# Patient Record
Sex: Female | Born: 1965 | Race: White | Hispanic: No | Smoking: Never smoker
Health system: Southern US, Community
[De-identification: ages and names within clinical notes are randomized; demographics above are authoritative.]

## PROBLEM LIST (undated history)

## (undated) DIAGNOSIS — S32049A Unspecified fracture of fourth lumbar vertebra, initial encounter for closed fracture: Secondary | ICD-10-CM

## (undated) DIAGNOSIS — K219 Gastro-esophageal reflux disease without esophagitis: Secondary | ICD-10-CM

## (undated) DIAGNOSIS — F319 Bipolar disorder, unspecified: Secondary | ICD-10-CM

## (undated) DIAGNOSIS — K439 Ventral hernia without obstruction or gangrene: Secondary | ICD-10-CM

## (undated) DIAGNOSIS — J189 Pneumonia, unspecified organism: Secondary | ICD-10-CM

## (undated) DIAGNOSIS — R519 Headache, unspecified: Secondary | ICD-10-CM

## (undated) DIAGNOSIS — R51 Headache: Secondary | ICD-10-CM

## (undated) DIAGNOSIS — I10 Essential (primary) hypertension: Secondary | ICD-10-CM

## (undated) DIAGNOSIS — F329 Major depressive disorder, single episode, unspecified: Secondary | ICD-10-CM

## (undated) DIAGNOSIS — B1920 Unspecified viral hepatitis C without hepatic coma: Secondary | ICD-10-CM

## (undated) DIAGNOSIS — F419 Anxiety disorder, unspecified: Secondary | ICD-10-CM

## (undated) DIAGNOSIS — F32A Depression, unspecified: Secondary | ICD-10-CM

## (undated) DIAGNOSIS — K7460 Unspecified cirrhosis of liver: Secondary | ICD-10-CM

## (undated) HISTORY — PX: AUGMENTATION MAMMAPLASTY: SUR837

## (undated) HISTORY — PX: ABDOMINAL HYSTERECTOMY: SHX81

## (undated) HISTORY — PX: DILATION AND CURETTAGE OF UTERUS: SHX78

## (undated) HISTORY — PX: ESOPHAGOGASTRODUODENOSCOPY: SHX1529

## (undated) HISTORY — PX: APPENDECTOMY: SHX54

## (undated) HISTORY — PX: ORIF TIBIA & FIBULA FRACTURES: SHX2131

## (undated) HISTORY — PX: FRACTURE SURGERY: SHX138

---

## 1985-04-26 HISTORY — PX: TUBAL LIGATION: SHX77

## 2003-07-22 ENCOUNTER — Emergency Department (HOSPITAL_COMMUNITY): Admission: EM | Admit: 2003-07-22 | Discharge: 2003-07-22 | Payer: Self-pay

## 2011-06-23 ENCOUNTER — Encounter (HOSPITAL_COMMUNITY): Payer: Self-pay | Admitting: *Deleted

## 2011-06-23 ENCOUNTER — Emergency Department (HOSPITAL_COMMUNITY)
Admission: EM | Admit: 2011-06-23 | Discharge: 2011-06-23 | Disposition: A | Discharge status: Home / Self Care | Payer: Medicare Other | Attending: Emergency Medicine | Admitting: Emergency Medicine

## 2011-06-23 DIAGNOSIS — N739 Female pelvic inflammatory disease, unspecified: Secondary | ICD-10-CM | POA: Diagnosis not present

## 2011-06-23 DIAGNOSIS — Z9079 Acquired absence of other genital organ(s): Secondary | ICD-10-CM | POA: Diagnosis not present

## 2011-06-23 DIAGNOSIS — F3289 Other specified depressive episodes: Secondary | ICD-10-CM | POA: Insufficient documentation

## 2011-06-23 DIAGNOSIS — Z9889 Other specified postprocedural states: Secondary | ICD-10-CM | POA: Diagnosis not present

## 2011-06-23 DIAGNOSIS — F329 Major depressive disorder, single episode, unspecified: Secondary | ICD-10-CM | POA: Diagnosis not present

## 2011-06-23 DIAGNOSIS — N390 Urinary tract infection, site not specified: Secondary | ICD-10-CM

## 2011-06-23 DIAGNOSIS — N730 Acute parametritis and pelvic cellulitis: Secondary | ICD-10-CM

## 2011-06-23 HISTORY — DX: Major depressive disorder, single episode, unspecified: F32.9

## 2011-06-23 HISTORY — DX: Depression, unspecified: F32.A

## 2011-06-23 LAB — URINALYSIS, ROUTINE W REFLEX MICROSCOPIC
Bilirubin Urine: NEGATIVE
Glucose, UA: NEGATIVE mg/dL
Ketones, ur: NEGATIVE mg/dL
Nitrite: POSITIVE — AB
Protein, ur: NEGATIVE mg/dL
Specific Gravity, Urine: 1.009 (ref 1.005–1.030)
Urobilinogen, UA: 0.2 mg/dL (ref 0.0–1.0)
pH: 7 (ref 5.0–8.0)

## 2011-06-23 LAB — URINE MICROSCOPIC-ADD ON

## 2011-06-23 LAB — BASIC METABOLIC PANEL
BUN: 11 mg/dL (ref 6–23)
CO2: 25 mEq/L (ref 19–32)
Calcium: 8.8 mg/dL (ref 8.4–10.5)
Chloride: 106 mEq/L (ref 96–112)
Creatinine, Ser: 1.04 mg/dL (ref 0.50–1.10)
GFR calc Af Amer: 74 mL/min — ABNORMAL LOW (ref >90)
GFR calc non Af Amer: 64 mL/min — ABNORMAL LOW (ref >90)
Glucose, Bld: 78 mg/dL (ref 70–99)
Potassium: 3.9 mEq/L (ref 3.5–5.1)
Sodium: 140 mEq/L (ref 135–145)

## 2011-06-23 LAB — CBC
HCT: 38.1 % (ref 36.0–46.0)
Hemoglobin: 12.3 g/dL (ref 12.0–15.0)
MCH: 29.4 pg (ref 26.0–34.0)
MCHC: 32.3 g/dL (ref 30.0–36.0)
MCV: 91.1 fL (ref 78.0–100.0)
Platelets: 219 10*3/uL (ref 150–400)
RBC: 4.18 MIL/uL (ref 3.87–5.11)
RDW: 12.9 % (ref 11.5–15.5)
WBC: 5 10*3/uL (ref 4.0–10.5)

## 2011-06-23 LAB — WET PREP, GENITAL
Clue Cells Wet Prep HPF POC: NONE SEEN
Yeast Wet Prep HPF POC: NONE SEEN

## 2011-06-23 MED ORDER — MORPHINE SULFATE 4 MG/ML IJ SOLN
4.0000 mg | Freq: Once | INTRAMUSCULAR | Status: AC
  Administered 2011-06-23: 4 mg via INTRAVENOUS
  Filled 2011-06-23: qty 1

## 2011-06-23 MED ORDER — ONDANSETRON HCL 4 MG/2ML IJ SOLN
4.0000 mg | Freq: Once | INTRAMUSCULAR | Status: AC
  Administered 2011-06-23: 4 mg via INTRAVENOUS
  Filled 2011-06-23: qty 2

## 2011-06-23 MED ORDER — DOXYCYCLINE HYCLATE 100 MG PO CAPS: 100.0000 mg | ORAL_CAPSULE | Freq: Two times a day (BID) | ORAL | Status: AC

## 2011-06-23 MED ORDER — HYDROCODONE-ACETAMINOPHEN 5-325 MG PO TABS: 1.0000 | ORAL_TABLET | Freq: Four times a day (QID) | ORAL | Status: AC | PRN

## 2011-06-23 MED ORDER — KETOROLAC TROMETHAMINE 30 MG/ML IJ SOLN
30.0000 mg | Freq: Once | INTRAMUSCULAR | Status: AC
  Administered 2011-06-23: 30 mg via INTRAVENOUS
  Filled 2011-06-23: qty 1

## 2011-06-23 MED ORDER — SODIUM CHLORIDE 0.9 % IV BOLUS (SEPSIS)
1000.0000 mL | Freq: Once | INTRAVENOUS | Status: AC
  Administered 2011-06-23: 1000 mL via INTRAVENOUS

## 2011-06-23 MED ORDER — DEXTROSE 5 % IV SOLN
1.0000 g | Freq: Once | INTRAVENOUS | Status: AC
  Administered 2011-06-23: 1 g via INTRAVENOUS
  Filled 2011-06-23: qty 10

## 2011-06-23 MED ORDER — METRONIDAZOLE 500 MG PO TABS
2000.0000 mg | ORAL_TABLET | Freq: Once | ORAL | Status: AC
  Administered 2011-06-23: 2000 mg via ORAL
  Filled 2011-06-23: qty 4

## 2011-06-23 NOTE — ED Provider Notes (Signed)
History     CSN: [id number]  Arrival date & time 06/23/11  0619   First MD Initiated Contact with Patient 06/23/11 [phone]      Chief Complaint  Patient presents with  . Urinary Tract Infection    (Consider location/radiation/quality/duration/timing/severity/associated sxs/prior treatment) HPI Comments: Jamie [name], a 46 year old woman, presents with 8 day history of lower abdominal pain, right sided flank pain, burning with urination, urinary frequency, foul smelling urine, fever and chills. She also reports a 6 day history of fever, nausea, and vomiting. Jamie [name] reports that she has had UTIs in the past and that her symptoms are the same as her previous UTIs. She tried to treat her pain symptoms with "Azo" pills and cranberry juice, without relief. Tylenol every 4 hours has kept her fever down. Nothing makes her pain worse. Jamie [name] describes her lower abdominal pain a burning, aching pain. Her right-sided flank pain radiates to the left, but is worst on the right.   Jamie [name] also reports a green vaginal discharge. She is worried that she might have an STI because her husband cheated on her. She has not been tested for an STI since then. Nothing makes the discharge better or worse.   The history is provided by the patient.    Past Medical History  Diagnosis Date  . Depression     Past Surgical History  Procedure Date  . Abdominal hysterectomy   . Appendectomy   . Cesarean section   . Tibia fracture surgery     left    History reviewed. No pertinent family history.  History  Substance Use Topics  . Smoking status: Never Smoker   . Smokeless tobacco: Not on file  . Alcohol Use: Yes     occaisional    OB History    Grav Para Term Preterm Abortions TAB SAB Ect Mult Living                  Review of Systems  Constitutional: Positive for fever and chills.  Respiratory: Negative for cough and shortness of breath.   Gastrointestinal: Positive for nausea,  vomiting and abdominal pain. Negative for blood in stool and abdominal distention.  Genitourinary: Positive for dysuria, urgency, frequency, flank pain (right sided), vaginal discharge and pelvic pain.  Skin: Negative for rash.    Allergies  Review of patient's allergies indicates no known allergies.  Home Medications   Current Outpatient Rx  Name Route Sig Dispense Refill  . ACETAMINOPHEN 500 MG PO TABS Oral Take 500-1,000 mg by mouth every 6 (six) hours as needed. pain    . BUPROPION HCL ER (SR) 100 MG PO TB12 Oral Take 200 mg by mouth 2 (two) times daily.    Marland Kitchen PHENAZOPYRIDINE HCL 95 MG PO TABS Oral Take 95 mg by mouth 3 (three) times daily as needed. Urinary pain      BP 135/88  Pulse 98  Temp(Src) 98.5 F (36.9 C) (Oral)  Resp 18  SpO2 100%  Physical Exam  Constitutional: She appears well-developed and well-nourished. No distress.  Cardiovascular: Normal rate, regular rhythm and normal heart sounds.   No murmur heard. Pulmonary/Chest: Effort normal and breath sounds normal. No respiratory distress. She has no wheezes. She has no rales.  Abdominal: Soft. Bowel sounds are normal. She exhibits no distension and no mass. There is tenderness. There is no rebound and no guarding.  Genitourinary: Vagina normal.       Purulent, green, foul-smelling discharge visible in  vaginal vault and on speculum. Patient is s/p total hysterectomy. No cervix.   Diffusely tender to palpation in pelvic region on bimanual exam.   Skin: Skin is warm and dry. She is not diaphoretic.    ED Course  Procedures (including critical care time)  Labs Reviewed  URINALYSIS, ROUTINE W REFLEX MICROSCOPIC - Abnormal; Notable for the following:    APPearance CLOUDY (*)    Hgb urine dipstick MODERATE (*)    Nitrite POSITIVE (*)    Leukocytes, UA LARGE (*)    All other components within normal limits  URINE MICROSCOPIC-ADD ON - Abnormal; Notable for the following:    Squamous Epithelial / LPF FEW (*)     Bacteria, UA MANY (*)    All other components within normal limits  URINE CULTURE  CBC  BASIC METABOLIC PANEL  GC/CHLAMYDIA PROBE AMP, GENITAL  WET PREP, GENITAL     The patient has not had any fever or vomiting here in the ER> The patient has been rechecked x5 by me. The patient is stable and tolerating oral fluids. I feel that his is PID based on her HPI and PE. The patient will be treated for this and advised to return here as needed. The patient will be advised to recheck with her PCP as well.     MDM  MDM Reviewed: nursing note and vitals Interpretation: labs            Carlyle Dolly, PA-C 06/23/11 1148

## 2011-06-23 NOTE — ED Notes (Signed)
First meeting with patient. Patient states she has burning and discomfort when she urinates with pain when she pushes down to empty her bladder x 8 days.Patient states she has green/yellow discharge. EDP at bedside.

## 2011-06-23 NOTE — ED Notes (Signed)
Patient resting with NAD at this time.  

## 2011-06-23 NOTE — ED Provider Notes (Signed)
Medical screening examination/treatment/procedure(s) were performed by non-physician practitioner and as supervising physician I was immediately available for consultation/collaboration.   Nat Christen, MD 06/23/11 [phone]

## 2011-06-23 NOTE — ED Notes (Signed)
Pt c/o n/v, fever, urinary frequency/pain for the past 8 days.  States she has had a UTI before and this is what is feels like.  Alert and oriented x 4, MAE.

## 2011-06-23 NOTE — ED Notes (Signed)
Patient resting with NAD

## 2011-06-23 NOTE — ED Notes (Signed)
C/o UTI sx, constant pain, onset 1 week ago, no relief with AZO and cranberry juice, also reports d/c, dysuria, nausea and back pain, (denies: fever or vomiting).

## 2011-06-24 LAB — GC/CHLAMYDIA PROBE AMP, GENITAL
Chlamydia, DNA Probe: NEGATIVE
GC Probe Amp, Genital: NEGATIVE

## 2011-06-25 LAB — URINE CULTURE
Colony Count: >=100000
Culture  Setup Time: 201302271034

## 2011-06-26 NOTE — ED Notes (Addendum)
+   Urine Chart sent to EDP office for review. 

## 2011-06-28 NOTE — ED Notes (Signed)
Patient informed of positive results after id'd x 2 and informed of need to notify partner to be treated.   Chart returned from EDP office and rx written for bactrim DS 1 tab po BID x 3 days needs to be called to [facility] [phone].rx called by PFM

## 2011-09-10 DIAGNOSIS — F4310 Post-traumatic stress disorder, unspecified: Secondary | ICD-10-CM | POA: Diagnosis not present

## 2011-09-10 DIAGNOSIS — F259 Schizoaffective disorder, unspecified: Secondary | ICD-10-CM | POA: Diagnosis not present

## 2011-11-12 DIAGNOSIS — F4310 Post-traumatic stress disorder, unspecified: Secondary | ICD-10-CM | POA: Diagnosis not present

## 2011-11-21 DIAGNOSIS — R112 Nausea with vomiting, unspecified: Secondary | ICD-10-CM | POA: Diagnosis not present

## 2011-11-21 DIAGNOSIS — R109 Unspecified abdominal pain: Secondary | ICD-10-CM | POA: Diagnosis not present

## 2011-11-21 DIAGNOSIS — N390 Urinary tract infection, site not specified: Secondary | ICD-10-CM | POA: Diagnosis not present

## 2011-11-21 DIAGNOSIS — A499 Bacterial infection, unspecified: Secondary | ICD-10-CM | POA: Diagnosis not present

## 2011-12-23 DIAGNOSIS — R300 Dysuria: Secondary | ICD-10-CM | POA: Diagnosis not present

## 2011-12-23 DIAGNOSIS — R319 Hematuria, unspecified: Secondary | ICD-10-CM | POA: Diagnosis not present

## 2011-12-23 DIAGNOSIS — N390 Urinary tract infection, site not specified: Secondary | ICD-10-CM | POA: Diagnosis not present

## 2011-12-23 DIAGNOSIS — IMO0002 Reserved for concepts with insufficient information to code with codable children: Secondary | ICD-10-CM | POA: Diagnosis not present

## 2012-03-07 DIAGNOSIS — Z23 Encounter for immunization: Secondary | ICD-10-CM | POA: Diagnosis not present

## 2012-03-11 DIAGNOSIS — W010XXA Fall on same level from slipping, tripping and stumbling without subsequent striking against object, initial encounter: Secondary | ICD-10-CM | POA: Diagnosis not present

## 2012-03-11 DIAGNOSIS — S93409A Sprain of unspecified ligament of unspecified ankle, initial encounter: Secondary | ICD-10-CM | POA: Diagnosis not present

## 2012-05-03 DIAGNOSIS — F259 Schizoaffective disorder, unspecified: Secondary | ICD-10-CM | POA: Diagnosis not present

## 2012-05-31 DIAGNOSIS — R112 Nausea with vomiting, unspecified: Secondary | ICD-10-CM | POA: Diagnosis not present

## 2012-05-31 DIAGNOSIS — A499 Bacterial infection, unspecified: Secondary | ICD-10-CM | POA: Diagnosis not present

## 2012-05-31 DIAGNOSIS — E860 Dehydration: Secondary | ICD-10-CM | POA: Diagnosis not present

## 2012-05-31 DIAGNOSIS — M545 Low back pain, unspecified: Secondary | ICD-10-CM | POA: Diagnosis not present

## 2012-05-31 DIAGNOSIS — N390 Urinary tract infection, site not specified: Secondary | ICD-10-CM | POA: Diagnosis not present

## 2012-07-01 DIAGNOSIS — R5381 Other malaise: Secondary | ICD-10-CM | POA: Diagnosis not present

## 2012-07-01 DIAGNOSIS — IMO0002 Reserved for concepts with insufficient information to code with codable children: Secondary | ICD-10-CM | POA: Diagnosis not present

## 2012-07-01 DIAGNOSIS — R300 Dysuria: Secondary | ICD-10-CM | POA: Diagnosis not present

## 2012-07-03 DIAGNOSIS — B9689 Other specified bacterial agents as the cause of diseases classified elsewhere: Secondary | ICD-10-CM | POA: Diagnosis not present

## 2012-07-03 DIAGNOSIS — M79609 Pain in unspecified limb: Secondary | ICD-10-CM | POA: Diagnosis not present

## 2012-07-03 DIAGNOSIS — A419 Sepsis, unspecified organism: Secondary | ICD-10-CM | POA: Diagnosis not present

## 2012-07-03 DIAGNOSIS — L0291 Cutaneous abscess, unspecified: Secondary | ICD-10-CM | POA: Diagnosis not present

## 2012-07-03 DIAGNOSIS — E860 Dehydration: Secondary | ICD-10-CM | POA: Diagnosis not present

## 2012-07-03 DIAGNOSIS — K219 Gastro-esophageal reflux disease without esophagitis: Secondary | ICD-10-CM | POA: Diagnosis not present

## 2012-07-03 DIAGNOSIS — F319 Bipolar disorder, unspecified: Secondary | ICD-10-CM | POA: Diagnosis not present

## 2012-07-03 DIAGNOSIS — I808 Phlebitis and thrombophlebitis of other sites: Secondary | ICD-10-CM | POA: Diagnosis not present

## 2012-07-03 DIAGNOSIS — I82409 Acute embolism and thrombosis of unspecified deep veins of unspecified lower extremity: Secondary | ICD-10-CM | POA: Diagnosis not present

## 2012-07-03 DIAGNOSIS — IMO0002 Reserved for concepts with insufficient information to code with codable children: Secondary | ICD-10-CM | POA: Diagnosis not present

## 2012-07-03 DIAGNOSIS — I76 Septic arterial embolism: Secondary | ICD-10-CM | POA: Diagnosis not present

## 2012-07-03 DIAGNOSIS — A499 Bacterial infection, unspecified: Secondary | ICD-10-CM | POA: Diagnosis not present

## 2012-07-03 DIAGNOSIS — F3189 Other bipolar disorder: Secondary | ICD-10-CM | POA: Diagnosis not present

## 2012-07-03 DIAGNOSIS — E876 Hypokalemia: Secondary | ICD-10-CM | POA: Diagnosis present

## 2012-07-03 DIAGNOSIS — I809 Phlebitis and thrombophlebitis of unspecified site: Secondary | ICD-10-CM | POA: Diagnosis not present

## 2012-07-03 DIAGNOSIS — N390 Urinary tract infection, site not specified: Secondary | ICD-10-CM | POA: Diagnosis not present

## 2012-07-10 DIAGNOSIS — I82629 Acute embolism and thrombosis of deep veins of unspecified upper extremity: Secondary | ICD-10-CM | POA: Diagnosis not present

## 2012-07-12 DIAGNOSIS — K5900 Constipation, unspecified: Secondary | ICD-10-CM | POA: Diagnosis not present

## 2012-07-12 DIAGNOSIS — I82629 Acute embolism and thrombosis of deep veins of unspecified upper extremity: Secondary | ICD-10-CM | POA: Diagnosis not present

## 2012-07-12 DIAGNOSIS — L0390 Cellulitis, unspecified: Secondary | ICD-10-CM | POA: Diagnosis not present

## 2012-07-12 DIAGNOSIS — Z7901 Long term (current) use of anticoagulants: Secondary | ICD-10-CM | POA: Diagnosis not present

## 2012-07-12 DIAGNOSIS — Z79899 Other long term (current) drug therapy: Secondary | ICD-10-CM | POA: Diagnosis not present

## 2012-07-12 DIAGNOSIS — I82409 Acute embolism and thrombosis of unspecified deep veins of unspecified lower extremity: Secondary | ICD-10-CM | POA: Diagnosis not present

## 2012-07-12 DIAGNOSIS — L0291 Cutaneous abscess, unspecified: Secondary | ICD-10-CM | POA: Diagnosis not present

## 2012-07-13 DIAGNOSIS — R5381 Other malaise: Secondary | ICD-10-CM | POA: Diagnosis not present

## 2012-07-13 DIAGNOSIS — K5900 Constipation, unspecified: Secondary | ICD-10-CM | POA: Diagnosis not present

## 2012-07-13 DIAGNOSIS — R112 Nausea with vomiting, unspecified: Secondary | ICD-10-CM | POA: Diagnosis not present

## 2012-07-13 DIAGNOSIS — F17200 Nicotine dependence, unspecified, uncomplicated: Secondary | ICD-10-CM | POA: Diagnosis not present

## 2012-07-18 DIAGNOSIS — I82409 Acute embolism and thrombosis of unspecified deep veins of unspecified lower extremity: Secondary | ICD-10-CM | POA: Diagnosis not present

## 2012-07-18 DIAGNOSIS — G894 Chronic pain syndrome: Secondary | ICD-10-CM | POA: Diagnosis not present

## 2012-07-18 DIAGNOSIS — Z79899 Other long term (current) drug therapy: Secondary | ICD-10-CM | POA: Diagnosis not present

## 2012-10-09 DIAGNOSIS — R300 Dysuria: Secondary | ICD-10-CM | POA: Diagnosis not present

## 2012-10-09 DIAGNOSIS — I82409 Acute embolism and thrombosis of unspecified deep veins of unspecified lower extremity: Secondary | ICD-10-CM | POA: Diagnosis not present

## 2012-10-09 DIAGNOSIS — R791 Abnormal coagulation profile: Secondary | ICD-10-CM | POA: Diagnosis not present

## 2012-10-11 DIAGNOSIS — R791 Abnormal coagulation profile: Secondary | ICD-10-CM | POA: Diagnosis not present

## 2012-11-21 DIAGNOSIS — F4310 Post-traumatic stress disorder, unspecified: Secondary | ICD-10-CM | POA: Diagnosis not present

## 2013-02-12 DIAGNOSIS — IMO0002 Reserved for concepts with insufficient information to code with codable children: Secondary | ICD-10-CM | POA: Diagnosis not present

## 2013-02-12 DIAGNOSIS — N390 Urinary tract infection, site not specified: Secondary | ICD-10-CM | POA: Diagnosis not present

## 2013-04-27 DIAGNOSIS — J111 Influenza due to unidentified influenza virus with other respiratory manifestations: Secondary | ICD-10-CM | POA: Diagnosis not present

## 2013-06-06 DIAGNOSIS — F259 Schizoaffective disorder, unspecified: Secondary | ICD-10-CM | POA: Diagnosis not present

## 2013-07-05 DIAGNOSIS — F3113 Bipolar disorder, current episode manic without psychotic features, severe: Secondary | ICD-10-CM | POA: Diagnosis not present

## 2013-07-20 DIAGNOSIS — Z79899 Other long term (current) drug therapy: Secondary | ICD-10-CM | POA: Diagnosis not present

## 2013-07-24 DIAGNOSIS — Z79899 Other long term (current) drug therapy: Secondary | ICD-10-CM | POA: Diagnosis not present

## 2013-07-26 DIAGNOSIS — F3113 Bipolar disorder, current episode manic without psychotic features, severe: Secondary | ICD-10-CM | POA: Diagnosis not present

## 2013-08-22 DIAGNOSIS — IMO0002 Reserved for concepts with insufficient information to code with codable children: Secondary | ICD-10-CM | POA: Diagnosis not present

## 2013-10-03 DIAGNOSIS — Z79899 Other long term (current) drug therapy: Secondary | ICD-10-CM | POA: Diagnosis not present

## 2013-10-04 DIAGNOSIS — F3113 Bipolar disorder, current episode manic without psychotic features, severe: Secondary | ICD-10-CM | POA: Diagnosis not present

## 2013-10-24 DIAGNOSIS — R300 Dysuria: Secondary | ICD-10-CM | POA: Diagnosis not present

## 2013-10-24 DIAGNOSIS — R599 Enlarged lymph nodes, unspecified: Secondary | ICD-10-CM | POA: Diagnosis not present

## 2013-10-24 DIAGNOSIS — N390 Urinary tract infection, site not specified: Secondary | ICD-10-CM | POA: Diagnosis not present

## 2013-11-21 DIAGNOSIS — F3113 Bipolar disorder, current episode manic without psychotic features, severe: Secondary | ICD-10-CM | POA: Diagnosis not present

## 2014-03-13 DIAGNOSIS — F3111 Bipolar disorder, current episode manic without psychotic features, mild: Secondary | ICD-10-CM | POA: Diagnosis not present

## 2014-04-10 DIAGNOSIS — F3113 Bipolar disorder, current episode manic without psychotic features, severe: Secondary | ICD-10-CM | POA: Diagnosis not present

## 2014-04-15 DIAGNOSIS — Z79899 Other long term (current) drug therapy: Secondary | ICD-10-CM | POA: Diagnosis not present

## 2014-05-02 DIAGNOSIS — F3113 Bipolar disorder, current episode manic without psychotic features, severe: Secondary | ICD-10-CM | POA: Diagnosis not present

## 2014-05-08 DIAGNOSIS — Z205 Contact with and (suspected) exposure to viral hepatitis: Secondary | ICD-10-CM | POA: Diagnosis not present

## 2014-05-08 DIAGNOSIS — B1920 Unspecified viral hepatitis C without hepatic coma: Secondary | ICD-10-CM | POA: Diagnosis not present

## 2014-06-19 DIAGNOSIS — F3113 Bipolar disorder, current episode manic without psychotic features, severe: Secondary | ICD-10-CM | POA: Diagnosis not present

## 2014-07-24 DIAGNOSIS — A5901 Trichomonal vulvovaginitis: Secondary | ICD-10-CM | POA: Diagnosis not present

## 2014-07-24 DIAGNOSIS — Z6827 Body mass index (BMI) 27.0-27.9, adult: Secondary | ICD-10-CM | POA: Diagnosis not present

## 2014-08-02 ENCOUNTER — Other Ambulatory Visit: Payer: Self-pay | Admitting: Gastroenterology

## 2014-08-02 DIAGNOSIS — B1920 Unspecified viral hepatitis C without hepatic coma: Secondary | ICD-10-CM | POA: Diagnosis not present

## 2014-08-02 DIAGNOSIS — R634 Abnormal weight loss: Secondary | ICD-10-CM

## 2014-08-02 DIAGNOSIS — R109 Unspecified abdominal pain: Secondary | ICD-10-CM | POA: Diagnosis not present

## 2014-08-06 ENCOUNTER — Other Ambulatory Visit: Payer: Self-pay

## 2014-08-06 DIAGNOSIS — F3113 Bipolar disorder, current episode manic without psychotic features, severe: Secondary | ICD-10-CM | POA: Diagnosis not present

## 2014-08-07 ENCOUNTER — Other Ambulatory Visit (HOSPITAL_COMMUNITY): Payer: Self-pay | Admitting: Gastroenterology

## 2014-08-07 DIAGNOSIS — B1920 Unspecified viral hepatitis C without hepatic coma: Secondary | ICD-10-CM

## 2014-08-07 DIAGNOSIS — B182 Chronic viral hepatitis C: Secondary | ICD-10-CM

## 2014-08-09 ENCOUNTER — Other Ambulatory Visit: Payer: Self-pay | Admitting: Gastroenterology

## 2014-08-09 ENCOUNTER — Ambulatory Visit
Admission: RE | Admit: 2014-08-09 | Discharge: 2014-08-09 | Disposition: A | Discharge status: Home / Self Care | Payer: Medicare Other | Source: Ambulatory Visit | Attending: Gastroenterology | Admitting: Gastroenterology

## 2014-08-09 DIAGNOSIS — R112 Nausea with vomiting, unspecified: Secondary | ICD-10-CM | POA: Diagnosis not present

## 2014-08-09 DIAGNOSIS — R634 Abnormal weight loss: Secondary | ICD-10-CM

## 2014-08-09 DIAGNOSIS — K828 Other specified diseases of gallbladder: Secondary | ICD-10-CM | POA: Diagnosis not present

## 2014-08-09 DIAGNOSIS — K869 Disease of pancreas, unspecified: Secondary | ICD-10-CM

## 2014-08-09 DIAGNOSIS — K838 Other specified diseases of biliary tract: Secondary | ICD-10-CM | POA: Diagnosis not present

## 2014-08-23 ENCOUNTER — Ambulatory Visit
Admission: RE | Admit: 2014-08-23 | Discharge: 2014-08-23 | Disposition: A | Discharge status: Home / Self Care | Payer: Medicare Other | Source: Ambulatory Visit | Attending: Gastroenterology | Admitting: Gastroenterology

## 2014-08-23 DIAGNOSIS — K869 Disease of pancreas, unspecified: Secondary | ICD-10-CM

## 2014-08-24 ENCOUNTER — Ambulatory Visit
Admission: RE | Admit: 2014-08-24 | Discharge: 2014-08-24 | Disposition: A | Discharge status: Home / Self Care | Payer: Medicare Other | Source: Ambulatory Visit | Attending: Gastroenterology | Admitting: Gastroenterology

## 2014-08-24 DIAGNOSIS — B182 Chronic viral hepatitis C: Secondary | ICD-10-CM | POA: Diagnosis not present

## 2014-08-24 DIAGNOSIS — D1803 Hemangioma of intra-abdominal structures: Secondary | ICD-10-CM | POA: Diagnosis not present

## 2014-08-24 DIAGNOSIS — K8020 Calculus of gallbladder without cholecystitis without obstruction: Secondary | ICD-10-CM | POA: Diagnosis not present

## 2014-08-24 MED ORDER — GADOBENATE DIMEGLUMINE 529 MG/ML IV SOLN
13.0000 mL | Freq: Once | INTRAVENOUS | Status: AC | PRN
  Administered 2014-08-24: 13 mL via INTRAVENOUS

## 2014-08-26 ENCOUNTER — Telehealth (HOSPITAL_COMMUNITY): Payer: Self-pay

## 2014-08-26 NOTE — Telephone Encounter (Signed)
Called and spoke to pt to remind her of her appt on 08/27/14 at 7am and make sure she will be npo for exam. AW

## 2014-08-27 ENCOUNTER — Ambulatory Visit (HOSPITAL_COMMUNITY)
Admission: RE | Admit: 2014-08-27 | Discharge: 2014-08-27 | Disposition: A | Discharge status: Home / Self Care | Payer: Medicare Other | Source: Ambulatory Visit | Attending: Gastroenterology | Admitting: Gastroenterology

## 2014-08-27 DIAGNOSIS — B1920 Unspecified viral hepatitis C without hepatic coma: Secondary | ICD-10-CM | POA: Diagnosis not present

## 2014-08-27 DIAGNOSIS — R109 Unspecified abdominal pain: Secondary | ICD-10-CM | POA: Diagnosis not present

## 2014-08-27 DIAGNOSIS — K8020 Calculus of gallbladder without cholecystitis without obstruction: Secondary | ICD-10-CM | POA: Diagnosis not present

## 2014-08-27 DIAGNOSIS — K7460 Unspecified cirrhosis of liver: Secondary | ICD-10-CM | POA: Diagnosis not present

## 2014-08-27 DIAGNOSIS — K828 Other specified diseases of gallbladder: Secondary | ICD-10-CM | POA: Diagnosis not present

## 2014-09-24 DIAGNOSIS — K222 Esophageal obstruction: Secondary | ICD-10-CM | POA: Diagnosis not present

## 2014-09-24 DIAGNOSIS — K2970 Gastritis, unspecified, without bleeding: Secondary | ICD-10-CM | POA: Diagnosis not present

## 2014-09-24 DIAGNOSIS — K449 Diaphragmatic hernia without obstruction or gangrene: Secondary | ICD-10-CM | POA: Diagnosis not present

## 2014-09-24 DIAGNOSIS — R1013 Epigastric pain: Secondary | ICD-10-CM | POA: Diagnosis not present

## 2014-09-24 DIAGNOSIS — K7460 Unspecified cirrhosis of liver: Secondary | ICD-10-CM | POA: Diagnosis not present

## 2014-09-24 DIAGNOSIS — K209 Esophagitis, unspecified: Secondary | ICD-10-CM | POA: Diagnosis not present

## 2014-12-13 DIAGNOSIS — B1920 Unspecified viral hepatitis C without hepatic coma: Secondary | ICD-10-CM | POA: Diagnosis not present

## 2014-12-25 ENCOUNTER — Encounter (HOSPITAL_COMMUNITY): Payer: Self-pay | Admitting: *Deleted

## 2014-12-25 ENCOUNTER — Ambulatory Visit: Payer: Self-pay | Admitting: Surgery

## 2014-12-25 DIAGNOSIS — K8010 Calculus of gallbladder with chronic cholecystitis without obstruction: Secondary | ICD-10-CM | POA: Diagnosis present

## 2014-12-25 MED ORDER — CEFAZOLIN SODIUM-DEXTROSE 2-3 GM-% IV SOLR
2.0000 g | INTRAVENOUS | Status: AC
  Administered 2014-12-26: 2 g via INTRAVENOUS
  Filled 2014-12-25: qty 50

## 2014-12-25 NOTE — H&P (Signed)
General Surgery [facility]  Jamie [name]. Caracci 12/25/2014 9:19 AM Location: [facility] Patient #: [id number] DOB: June 25, 1965 Single / Language: Jamie [name] / Race: White Female  History of Present Illness Jamie Regal MD; 12/25/2014 9:57 AM) Patient words: gallbladder.  The patient is a 49 year old female who presents for evaluation of gall stones. Patient is referred by Dr. Wonda [name] for surgical treatment of symptomatic cholelithiasis and chronic cholecystitis. Patient presented in the spring with abdominal pain. This was initially intermittent. Pain has become more constant and more severe over the past 2 weeks. She has had nausea and vomiting. She experiences chills. She has been using Phenergan suppositories for control of symptoms. Patient notes that pretty much all foods exacerbate her pain. She denies any history of jaundice or acholic stools. She has had problems with constipation. Evaluation has included abdominal ultrasound which shows gallbladder sludge and small gallstones. Patient underwent an MRI scan to rule out a pancreatic mass and this study also confirmed multiple gallstones without choledocholithiasis. Patient has had an upper endoscopy showing esophagitis. Previous abdominal surgery includes gynecologic procedures only. Patient now presents for consideration for cholecystectomy for symptomatic cholelithiasis, abdominal pain, and probable chronic cholecystitis. Patient does have a history of liver disease and possible hepatitis C.   Other Problems Jamie Lorenzo, LPN; 9/38/1017 5:10 AM) Alcohol Abuse Anxiety Disorder Cirrhosis Of Liver Gastroesophageal Reflux Disease Inguinal Hernia  Past Surgical History Jamie Lorenzo, LPN; 2/58/5277 8:24 AM) Cesarean Section - 1  Diagnostic Studies History Jamie Lorenzo, LPN; 2/35/3614 4:31 AM) Colonoscopy never Mammogram 1-3 years ago  Allergies Jamie Lorenzo, LPN;  5/40/0867 6:19 AM) No Known Drug Allergies08/31/2016  Medication History Jamie Lorenzo, LPN; 09/01/3265 1:24 AM) Sucralfate (1GM Tablet, Oral as needed) Active. Aleve (220MG  Tablet, Oral as needed) Active. Excedrin Back & Body (250-250MG  Tablet, Oral as needed) Active. Medications Reconciled  Social History Jamie Lorenzo, LPN; 5/80/9983 3:82 AM) Alcohol use Recently quit alcohol use. Illicit drug use Uses socially only. Tobacco use Never smoker.  Family History Jamie Lorenzo, LPN; 08/29/3974 7:34 AM) Alcohol Abuse Family Members In General. Migraine Headache Mother.  Pregnancy / Birth History Jamie Lorenzo, LPN; 1/93/7902 4:09 AM) Age at menarche 50 years. Age of menopause 61-50 Gravida 71 Maternal age 60-20 Para 3  Review of Systems Jamie Lorenzo LPN; 7/35/3299 2:42 AM) General Present- Appetite Loss, Fatigue and Weight Loss. Not Present- Chills, Fever, Night Sweats and Weight Gain. Skin Present- Dryness and Non-Healing Wounds. Not Present- Change in Wart/Mole, Hives, Jaundice, New Lesions, Rash and Ulcer. HEENT Not Present- Earache, Hearing Loss, Hoarseness, Nose Bleed, Oral Ulcers, Ringing in the Ears, Seasonal Allergies, Sinus Pain, Sore Throat, Visual Disturbances, Wears glasses/contact lenses and Yellow Eyes. Respiratory Not Present- Bloody sputum, Chronic Cough, Difficulty Breathing, Snoring and Wheezing. Breast Not Present- Breast Mass, Breast Pain, Nipple Discharge and Skin Changes. Cardiovascular Not Present- Chest Pain, Difficulty Breathing Lying Down, Leg Cramps, Palpitations, Rapid Heart Rate, Shortness of Breath and Swelling of Extremities. Gastrointestinal Present- Abdominal Pain, Constipation, Nausea and Vomiting. Not Present- Bloating, Bloody Stool, Change in Bowel Habits, Chronic diarrhea, Difficulty Swallowing, Excessive gas, Gets full quickly at meals, Hemorrhoids, Indigestion and Rectal Pain. Female Genitourinary Present- Urgency. Not Present-  Frequency, Nocturia, Painful Urination and Pelvic Pain. Musculoskeletal Present- Muscle Pain. Not Present- Back Pain, Joint Pain, Joint Stiffness, Muscle Weakness and Swelling of Extremities. Neurological Not Present- Decreased Memory, Fainting, Headaches, Numbness, Seizures, Tingling, Tremor, Trouble walking and Weakness. Psychiatric Present- Anxiety, Bipolar and Change in Sleep  Pattern. Not Present- Depression, Fearful and Frequent crying. Hematology Present- Easy Bruising. Not Present- Excessive bleeding, Gland problems, HIV and Persistent Infections.   Vitals Claiborne Billings Dockery LPN; 5/62/5638 9:37 AM) 12/25/2014 9:20 AM Weight: 138 lb Height: 64in Body Surface Area: 1.68 m Body Mass Index: 23.69 kg/m Temp.: 97.62F(Oral)  Pulse: 76 (Regular)  BP: 118/74 (Sitting, Left Arm, Standard)    Physical Exam Jamie Regal MD; 12/25/2014 9:58 AM) The physical exam findings are as follows: Note:General - appears uncomfortable, mild distress; not diaphorectic  HEENT - normocephalic; sclerae clear, gaze conjugate; mucous membranes moist, dentition good; voice normal  Neck - symmetric on extension; no palpable anterior or posterior cervical adenopathy; no palpable masses in the thyroid bed  Chest - clear bilaterally without rhonchi, rales, or wheeze  Cor - regular rhythm with normal rate; no significant murmur  Abd - soft without distension; well-healed surgical incision suprapubic; no palpable masses; mild to moderate tenderness in the epigastrium and right upper quadrant with voluntary guarding; no hepatosplenomegaly  Ext - non-tender without significant edema or lymphedema  Neuro - grossly intact; no tremor    Assessment & Plan Jamie Regal MD; 12/25/2014 10:00 AM) CHRONIC CHOLECYSTITIS WITH CALCULUS (574.10  K80.10) Current Plans  Patient presents on referral from gastroenterology with symptomatic cholelithiasis. Patient is provided with written literature regarding  gallbladder surgery to review at home.  Patient has had increasing symptoms for the past several months. Over the past 2 weeks she has developed persistent abdominal pain and nausea. She has been using Phenergan suppositories without relief. She requests narcotic pain medicine today. Patient would like to proceed with surgery as soon as possible.  Patient discussed laparoscopic cholecystectomy in detail. We discussed the risk and benefits of the procedure including the possibility of conversion to open surgery. We discussed the hospital stay to be anticipated. We discussed her postoperative recovery. She understands and wishes to proceed as soon as possible.  The risks and benefits of the procedure have been discussed at length with the patient. The patient understands the proposed procedure, potential alternative treatments, and the course of recovery to be expected. All of the patient's questions have been answered at this time. The patient wishes to proceed with surgery.  Jamie Regal, MD, [facility] Office: [phone]

## 2014-12-26 ENCOUNTER — Ambulatory Visit (HOSPITAL_COMMUNITY): Payer: Medicare Other | Admitting: Anesthesiology

## 2014-12-26 ENCOUNTER — Ambulatory Visit (HOSPITAL_COMMUNITY): Payer: Medicare Other

## 2014-12-26 ENCOUNTER — Encounter (HOSPITAL_COMMUNITY): Payer: Self-pay | Admitting: *Deleted

## 2014-12-26 ENCOUNTER — Observation Stay (HOSPITAL_COMMUNITY)
Admission: RE | Admit: 2014-12-26 | Discharge: 2014-12-27 | Disposition: A | Discharge status: Home / Self Care | Payer: Medicare Other | Source: Ambulatory Visit | Attending: Surgery | Admitting: Surgery

## 2014-12-26 ENCOUNTER — Encounter (HOSPITAL_COMMUNITY)
Admission: RE | Disposition: A | Discharge status: Home / Self Care | Payer: Self-pay | Source: Ambulatory Visit | Attending: Surgery

## 2014-12-26 DIAGNOSIS — K8010 Calculus of gallbladder with chronic cholecystitis without obstruction: Secondary | ICD-10-CM | POA: Diagnosis not present

## 2014-12-26 DIAGNOSIS — F419 Anxiety disorder, unspecified: Secondary | ICD-10-CM | POA: Insufficient documentation

## 2014-12-26 DIAGNOSIS — K7460 Unspecified cirrhosis of liver: Secondary | ICD-10-CM | POA: Insufficient documentation

## 2014-12-26 DIAGNOSIS — K8020 Calculus of gallbladder without cholecystitis without obstruction: Secondary | ICD-10-CM

## 2014-12-26 DIAGNOSIS — K811 Chronic cholecystitis: Secondary | ICD-10-CM | POA: Diagnosis present

## 2014-12-26 HISTORY — DX: Ventral hernia without obstruction or gangrene: K43.9

## 2014-12-26 HISTORY — DX: Gastro-esophageal reflux disease without esophagitis: K21.9

## 2014-12-26 HISTORY — PX: CHOLECYSTECTOMY: SHX55

## 2014-12-26 HISTORY — DX: Unspecified cirrhosis of liver: K74.60

## 2014-12-26 HISTORY — DX: Bipolar disorder, unspecified: F31.9

## 2014-12-26 LAB — COMPREHENSIVE METABOLIC PANEL
ALT: 20 U/L (ref 14–54)
AST: 37 U/L (ref 15–41)
Albumin: 2.7 g/dL — ABNORMAL LOW (ref 3.5–5.0)
Alkaline Phosphatase: 135 U/L — ABNORMAL HIGH (ref 38–126)
Anion gap: 9 (ref 5–15)
BUN: 12 mg/dL (ref 6–20)
CO2: 23 mmol/L (ref 22–32)
Calcium: 8.3 mg/dL — ABNORMAL LOW (ref 8.9–10.3)
Chloride: 104 mmol/L (ref 101–111)
Creatinine, Ser: 1.13 mg/dL — ABNORMAL HIGH (ref 0.44–1.00)
GFR calc Af Amer: >60 mL/min (ref >60)
GFR calc non Af Amer: 56 mL/min — ABNORMAL LOW (ref >60)
Glucose, Bld: 86 mg/dL (ref 65–99)
Potassium: 3.9 mmol/L (ref 3.5–5.1)
Sodium: 136 mmol/L (ref 135–145)
Total Bilirubin: 0.7 mg/dL (ref 0.3–1.2)
Total Protein: 6.3 g/dL — ABNORMAL LOW (ref 6.5–8.1)

## 2014-12-26 LAB — CBC
HCT: 38.2 % (ref 36.0–46.0)
Hemoglobin: 12.4 g/dL (ref 12.0–15.0)
MCH: 29.9 pg (ref 26.0–34.0)
MCHC: 32.5 g/dL (ref 30.0–36.0)
MCV: 92 fL (ref 78.0–100.0)
Platelets: 208 10*3/uL (ref 150–400)
RBC: 4.15 MIL/uL (ref 3.87–5.11)
RDW: 14.2 % (ref 11.5–15.5)
WBC: 4.3 10*3/uL (ref 4.0–10.5)

## 2014-12-26 SURGERY — LAPAROSCOPIC CHOLECYSTECTOMY WITH INTRAOPERATIVE CHOLANGIOGRAM
Anesthesia: General | Site: Abdomen

## 2014-12-26 MED ORDER — KETAMINE HCL 10 MG/ML IJ SOLN
INTRAMUSCULAR | Status: DC | PRN
  Administered 2014-12-26: 100 mg via INTRAVENOUS

## 2014-12-26 MED ORDER — LIDOCAINE HCL (CARDIAC) 20 MG/ML IV SOLN
INTRAVENOUS | Status: DC | PRN
  Administered 2014-12-26: 50 mg via INTRAVENOUS
  Administered 2014-12-26: 100 mg via INTRATRACHEAL

## 2014-12-26 MED ORDER — MIDAZOLAM HCL 5 MG/5ML IJ SOLN
INTRAMUSCULAR | Status: DC | PRN
  Administered 2014-12-26: 2 mg via INTRAVENOUS

## 2014-12-26 MED ORDER — HYDROMORPHONE HCL 1 MG/ML IJ SOLN
INTRAMUSCULAR | Status: AC
  Filled 2014-12-26: qty 2

## 2014-12-26 MED ORDER — VECURONIUM BROMIDE 10 MG IV SOLR
INTRAVENOUS | Status: DC | PRN
  Administered 2014-12-26: 4 mg via INTRAVENOUS

## 2014-12-26 MED ORDER — GLYCOPYRROLATE 0.2 MG/ML IJ SOLN
INTRAMUSCULAR | Status: AC
  Filled 2014-12-26: qty 3

## 2014-12-26 MED ORDER — DEXMEDETOMIDINE HCL 200 MCG/2ML IV SOLN
INTRAVENOUS | Status: DC | PRN
  Administered 2014-12-26: 10 ug via INTRAVENOUS
  Administered 2014-12-26: 12 ug via INTRAVENOUS
  Administered 2014-12-26: 10 ug via INTRAVENOUS

## 2014-12-26 MED ORDER — SODIUM CHLORIDE 0.9 % IV SOLN
INTRAVENOUS | Status: DC | PRN
  Administered 2014-12-26: 11:00:00

## 2014-12-26 MED ORDER — MEPERIDINE HCL 25 MG/ML IJ SOLN: 6.2500 mg | INTRAMUSCULAR | Status: DC | PRN

## 2014-12-26 MED ORDER — FENTANYL CITRATE (PF) 100 MCG/2ML IJ SOLN
50.0000 ug | Freq: Once | INTRAMUSCULAR | Status: AC
  Administered 2014-12-26: 50 ug via INTRAVENOUS

## 2014-12-26 MED ORDER — VECURONIUM BROMIDE 10 MG IV SOLR
INTRAVENOUS | Status: AC
  Filled 2014-12-26: qty 10

## 2014-12-26 MED ORDER — KETAMINE HCL 100 MG/ML IJ SOLN
INTRAMUSCULAR | Status: AC
  Filled 2014-12-26: qty 1

## 2014-12-26 MED ORDER — PROPOFOL 10 MG/ML IV BOLUS
INTRAVENOUS | Status: AC
  Filled 2014-12-26: qty 20

## 2014-12-26 MED ORDER — DEXMEDETOMIDINE HCL IN NACL 200 MCG/50ML IV SOLN
INTRAVENOUS | Status: AC
  Filled 2014-12-26: qty 50

## 2014-12-26 MED ORDER — PROPOFOL 10 MG/ML IV BOLUS
INTRAVENOUS | Status: DC | PRN
  Administered 2014-12-26: 150 mg via INTRAVENOUS

## 2014-12-26 MED ORDER — FENTANYL CITRATE (PF) 100 MCG/2ML IJ SOLN
INTRAMUSCULAR | Status: DC | PRN
Start: 2014-12-26 — End: 2014-12-26
  Administered 2014-12-26: 100 ug via INTRAVENOUS
  Administered 2014-12-26: 50 ug via INTRAVENOUS
  Administered 2014-12-26: 100 ug via INTRAVENOUS
  Administered 2014-12-26: 50 ug via INTRAVENOUS

## 2014-12-26 MED ORDER — KCL IN DEXTROSE-NACL 20-5-0.45 MEQ/L-%-% IV SOLN
INTRAVENOUS | Status: AC
  Filled 2014-12-26: qty 1000

## 2014-12-26 MED ORDER — NEOSTIGMINE METHYLSULFATE 10 MG/10ML IV SOLN
INTRAVENOUS | Status: DC | PRN
  Administered 2014-12-26: 4 mg via INTRAVENOUS

## 2014-12-26 MED ORDER — ACETAMINOPHEN 650 MG RE SUPP: 650.0000 mg | Freq: Four times a day (QID) | RECTAL | Status: DC | PRN

## 2014-12-26 MED ORDER — ONDANSETRON HCL 4 MG/2ML IJ SOLN
INTRAMUSCULAR | Status: AC
  Filled 2014-12-26: qty 2

## 2014-12-26 MED ORDER — HYDROMORPHONE HCL 1 MG/ML IJ SOLN
0.2500 mg | INTRAMUSCULAR | Status: DC | PRN
  Administered 2014-12-26 (×4): 0.5 mg via INTRAVENOUS

## 2014-12-26 MED ORDER — DEXAMETHASONE SODIUM PHOSPHATE 4 MG/ML IJ SOLN
INTRAMUSCULAR | Status: AC
  Filled 2014-12-26: qty 1

## 2014-12-26 MED ORDER — FENTANYL CITRATE (PF) 100 MCG/2ML IJ SOLN
INTRAMUSCULAR | Status: AC
  Filled 2014-12-26: qty 2

## 2014-12-26 MED ORDER — NEOSTIGMINE METHYLSULFATE 10 MG/10ML IV SOLN
INTRAVENOUS | Status: AC
  Filled 2014-12-26: qty 1

## 2014-12-26 MED ORDER — HYDROMORPHONE HCL 1 MG/ML IJ SOLN
1.0000 mg | INTRAMUSCULAR | Status: DC | PRN
  Administered 2014-12-26 – 2014-12-27 (×5): 1 mg via INTRAVENOUS
  Filled 2014-12-26 (×5): qty 1

## 2014-12-26 MED ORDER — KETOROLAC TROMETHAMINE 30 MG/ML IJ SOLN
INTRAMUSCULAR | Status: AC
  Filled 2014-12-26: qty 1

## 2014-12-26 MED ORDER — BUPIVACAINE-EPINEPHRINE 0.25% -1:200000 IJ SOLN
INTRAMUSCULAR | Status: DC | PRN
  Administered 2014-12-26: 30 mL

## 2014-12-26 MED ORDER — PROMETHAZINE HCL 25 MG/ML IJ SOLN
6.2500 mg | INTRAMUSCULAR | Status: DC | PRN
  Administered 2014-12-26: 12.5 mg via INTRAVENOUS

## 2014-12-26 MED ORDER — ROCURONIUM BROMIDE 50 MG/5ML IV SOLN
INTRAVENOUS | Status: AC
  Filled 2014-12-26: qty 1

## 2014-12-26 MED ORDER — KETOROLAC TROMETHAMINE 30 MG/ML IJ SOLN
30.0000 mg | Freq: Once | INTRAMUSCULAR | Status: AC
  Administered 2014-12-26: 30 mg via INTRAVENOUS

## 2014-12-26 MED ORDER — LORAZEPAM 2 MG/ML IJ SOLN
1.0000 mg | Freq: Four times a day (QID) | INTRAMUSCULAR | Status: DC | PRN
  Administered 2014-12-26 – 2014-12-27 (×3): 1 mg via INTRAVENOUS
  Filled 2014-12-26 (×4): qty 1

## 2014-12-26 MED ORDER — ONDANSETRON HCL 4 MG/2ML IJ SOLN
INTRAMUSCULAR | Status: DC | PRN
  Administered 2014-12-26: 4 mg via INTRAVENOUS

## 2014-12-26 MED ORDER — SODIUM CHLORIDE 0.9 % IJ SOLN
INTRAMUSCULAR | Status: AC
  Filled 2014-12-26: qty 10

## 2014-12-26 MED ORDER — DIPHENHYDRAMINE HCL 25 MG PO CAPS
25.0000 mg | ORAL_CAPSULE | ORAL | Status: DC | PRN
  Administered 2014-12-26 – 2014-12-27 (×2): 25 mg via ORAL
  Filled 2014-12-26 (×2): qty 1

## 2014-12-26 MED ORDER — FENTANYL CITRATE (PF) 250 MCG/5ML IJ SOLN
INTRAMUSCULAR | Status: AC
  Filled 2014-12-26: qty 5

## 2014-12-26 MED ORDER — ONDANSETRON 4 MG PO TBDP: 4.0000 mg | ORAL_TABLET | Freq: Four times a day (QID) | ORAL | Status: DC | PRN

## 2014-12-26 MED ORDER — SODIUM CHLORIDE 0.9 % IR SOLN
Status: DC | PRN
  Administered 2014-12-26: 1000 mL

## 2014-12-26 MED ORDER — MIDAZOLAM HCL 2 MG/2ML IJ SOLN
INTRAMUSCULAR | Status: AC
  Filled 2014-12-26: qty 4

## 2014-12-26 MED ORDER — ACETAMINOPHEN 325 MG PO TABS: 650.0000 mg | ORAL_TABLET | Freq: Four times a day (QID) | ORAL | Status: DC | PRN

## 2014-12-26 MED ORDER — LACTATED RINGERS IV SOLN
INTRAVENOUS | Status: DC
  Administered 2014-12-26 (×2): via INTRAVENOUS

## 2014-12-26 MED ORDER — EPHEDRINE SULFATE 50 MG/ML IJ SOLN
INTRAMUSCULAR | Status: DC | PRN
  Administered 2014-12-26 (×2): 5 mg via INTRAVENOUS

## 2014-12-26 MED ORDER — BUPIVACAINE-EPINEPHRINE (PF) 0.25% -1:200000 IJ SOLN
INTRAMUSCULAR | Status: AC
  Filled 2014-12-26: qty 30

## 2014-12-26 MED ORDER — HYDROCODONE-ACETAMINOPHEN 5-325 MG PO TABS
1.0000 | ORAL_TABLET | ORAL | Status: DC | PRN
  Administered 2014-12-26 – 2014-12-27 (×3): 2 via ORAL
  Filled 2014-12-26 (×3): qty 2

## 2014-12-26 MED ORDER — 0.9 % SODIUM CHLORIDE (POUR BTL) OPTIME
TOPICAL | Status: DC | PRN
  Administered 2014-12-26: 1000 mL

## 2014-12-26 MED ORDER — ONDANSETRON HCL 4 MG/2ML IJ SOLN: 4.0000 mg | Freq: Four times a day (QID) | INTRAMUSCULAR | Status: DC | PRN

## 2014-12-26 MED ORDER — KCL IN DEXTROSE-NACL 20-5-0.45 MEQ/L-%-% IV SOLN
INTRAVENOUS | Status: DC
  Administered 2014-12-26: 13:00:00 via INTRAVENOUS

## 2014-12-26 MED ORDER — PROMETHAZINE HCL 25 MG/ML IJ SOLN
INTRAMUSCULAR | Status: AC
  Filled 2014-12-26: qty 1

## 2014-12-26 MED ORDER — GLYCOPYRROLATE 0.2 MG/ML IJ SOLN
INTRAMUSCULAR | Status: DC | PRN
  Administered 2014-12-26: .5 mg via INTRAVENOUS

## 2014-12-26 MED ORDER — LIDOCAINE HCL (CARDIAC) 20 MG/ML IV SOLN
INTRAVENOUS | Status: AC
  Filled 2014-12-26: qty 5

## 2014-12-26 SURGICAL SUPPLY — 39 items
APPLIER CLIP ROT 10 11.4 M/L (STAPLE) ×2
BENZOIN TINCTURE PRP APPL 2/3 (GAUZE/BANDAGES/DRESSINGS) ×2 IMPLANT
BLADE SURG CLIPPER 3M 9600 (MISCELLANEOUS) IMPLANT
CANISTER SUCTION 2500CC (MISCELLANEOUS) ×2 IMPLANT
CHLORAPREP W/TINT 26ML (MISCELLANEOUS) ×2 IMPLANT
CLIP APPLIE ROT 10 11.4 M/L (STAPLE) ×1 IMPLANT
COVER MAYO STAND STRL (DRAPES) ×2 IMPLANT
COVER SURGICAL LIGHT HANDLE (MISCELLANEOUS) ×2 IMPLANT
DRAPE C-ARM 42X72 X-RAY (DRAPES) ×2 IMPLANT
DRSG TEGADERM 2-3/8X2-3/4 SM (GAUZE/BANDAGES/DRESSINGS) ×2 IMPLANT
ELECT REM PT RETURN 9FT ADLT (ELECTROSURGICAL) ×2
ELECTRODE REM PT RTRN 9FT ADLT (ELECTROSURGICAL) ×1 IMPLANT
GAUZE SPONGE 2X2 8PLY STRL LF (GAUZE/BANDAGES/DRESSINGS) ×1 IMPLANT
GLOVE SURG ORTHO 8.0 STRL STRW (GLOVE) ×2 IMPLANT
GOWN STRL REUS W/ TWL LRG LVL3 (GOWN DISPOSABLE) ×2 IMPLANT
GOWN STRL REUS W/ TWL XL LVL3 (GOWN DISPOSABLE) ×1 IMPLANT
GOWN STRL REUS W/TWL LRG LVL3 (GOWN DISPOSABLE) ×2
GOWN STRL REUS W/TWL XL LVL3 (GOWN DISPOSABLE) ×1
KIT BASIN OR (CUSTOM PROCEDURE TRAY) ×2 IMPLANT
KIT ROOM TURNOVER OR (KITS) ×2 IMPLANT
NS IRRIG 1000ML POUR BTL (IV SOLUTION) ×2 IMPLANT
PAD ARMBOARD 7.5X6 YLW CONV (MISCELLANEOUS) ×2 IMPLANT
POUCH SPECIMEN RETRIEVAL 10MM (ENDOMECHANICALS) IMPLANT
SCISSORS LAP 5X35 DISP (ENDOMECHANICALS) ×2 IMPLANT
SET CHOLANGIOGRAPH 5 50 .035 (SET/KITS/TRAYS/PACK) ×2 IMPLANT
SET IRRIG TUBING LAPAROSCOPIC (IRRIGATION / IRRIGATOR) ×2 IMPLANT
SLEEVE ENDOPATH XCEL 5M (ENDOMECHANICALS) ×2 IMPLANT
SPECIMEN JAR SMALL (MISCELLANEOUS) ×2 IMPLANT
SPONGE GAUZE 2X2 STER 10/PKG (GAUZE/BANDAGES/DRESSINGS) ×1
STRIP CLOSURE SKIN 1/2X4 (GAUZE/BANDAGES/DRESSINGS) ×2 IMPLANT
SUT MNCRL AB 4-0 PS2 18 (SUTURE) ×2 IMPLANT
TAPE CLOTH SURG 4X10 WHT LF (GAUZE/BANDAGES/DRESSINGS) ×2 IMPLANT
TOWEL OR 17X24 6PK STRL BLUE (TOWEL DISPOSABLE) ×2 IMPLANT
TOWEL OR 17X26 10 PK STRL BLUE (TOWEL DISPOSABLE) ×2 IMPLANT
TRAY LAPAROSCOPIC MC (CUSTOM PROCEDURE TRAY) ×2 IMPLANT
TROCAR XCEL BLUNT TIP 100MML (ENDOMECHANICALS) ×2 IMPLANT
TROCAR XCEL NON-BLD 11X100MML (ENDOMECHANICALS) ×2 IMPLANT
TROCAR XCEL NON-BLD 5MMX100MML (ENDOMECHANICALS) ×2 IMPLANT
TUBING INSUFFLATION (TUBING) ×2 IMPLANT

## 2014-12-26 NOTE — Progress Notes (Signed)
Pt reports less pain and rates pain as a 5 on pain scale also reports less anxiety.

## 2014-12-26 NOTE — Anesthesia Postprocedure Evaluation (Signed)
  Anesthesia Post-op Note  Patient: Jamie [name]  Procedure(s) Performed: Procedure(s): LAPAROSCOPIC CHOLECYSTECTOMY WITH INTRAOPERATIVE CHOLANGIOGRAM (N/A)  Patient Location: PACU  Anesthesia Type:General  Level of Consciousness: awake and alert   Airway and Oxygen Therapy: Patient Spontanous Breathing  Post-op Pain: moderate  Post-op Assessment: Post-op Vital signs reviewed, Patient's Cardiovascular Status Stable, Respiratory Function Stable, Patent Airway and No signs of Nausea or vomiting              Post-op Vital Signs: Reviewed and stable  Last Vitals:  Filed Vitals:   12/26/14 1330  BP: 115/80  Pulse: 64  Temp: 36.5 C  Resp: 11    Complications: No apparent anesthesia complications

## 2014-12-26 NOTE — Progress Notes (Addendum)
Pt c/o pain in right side under her breast. Dr Lissa Hoard called and informed, new orders noted. Pt states that we not discuss any of her medical history in front of her mom and dad.

## 2014-12-26 NOTE — Anesthesia Procedure Notes (Signed)
Procedure Name: Intubation Date/Time: 12/26/2014 11:14 AM Performed by: Luciana Axe K Pre-anesthesia Checklist: Patient identified, Emergency Drugs available, Suction available, Patient being monitored and Timeout performed Patient Re-evaluated:Patient Re-evaluated prior to inductionOxygen Delivery Method: Circle system utilized Preoxygenation: Pre-oxygenation with 100% oxygen Intubation Type: IV induction Ventilation: Mask ventilation without difficulty Laryngoscope Size: Miller and 2 Grade View: Grade I Tube type: Oral Tube size: 7.0 mm Number of attempts: 1 Airway Equipment and Method: Stylet and LTA kit utilized Placement Confirmation: ETT inserted through vocal cords under direct vision,  positive ETCO2,  CO2 detector and breath sounds checked- equal and bilateral Secured at: 21 cm Tube secured with: Tape Dental Injury: Teeth and Oropharynx as per pre-operative assessment

## 2014-12-26 NOTE — Anesthesia Preprocedure Evaluation (Addendum)
Anesthesia Evaluation  Patient identified by MRN, date of birth, ID band Patient awake    Reviewed: Allergy & Precautions, NPO status , Patient's Chart, lab work & pertinent test results  Airway Mallampati: II  TM Distance: >3 FB Neck ROM: Full    Dental no notable dental hx. (+) Upper Dentures, Lower Dentures   Pulmonary neg pulmonary ROS,  breath sounds clear to auscultation  Pulmonary exam normal       Cardiovascular negative cardio ROS Normal cardiovascular examRhythm:Regular Rate:Normal     Neuro/Psych  Headaches, PSYCHIATRIC DISORDERS Bipolar Disorder    GI/Hepatic GERD-  ,(+) Cirrhosis -      ,   Endo/Other  negative endocrine ROS  Renal/GU negative Renal ROS     Musculoskeletal negative musculoskeletal ROS (+)   Abdominal   Peds  Hematology negative hematology ROS (+)   Anesthesia Other Findings   Reproductive/Obstetrics negative OB ROS                             Anesthesia Physical Anesthesia Plan  ASA: III  Anesthesia Plan: General   Post-op Pain Management:    Induction: Intravenous  Airway Management Planned: Oral ETT  Additional Equipment:   Intra-op Plan:   Post-operative Plan: Extubation in OR  Informed Consent: I have reviewed the patients History and Physical, chart, labs and discussed the procedure including the risks, benefits and alternatives for the proposed anesthesia with the patient or authorized representative who has indicated his/her understanding and acceptance.   Dental advisory given  Plan Discussed with: CRNA  Anesthesia Plan Comments:         Anesthesia Quick Evaluation

## 2014-12-26 NOTE — Interval H&P Note (Signed)
History and Physical Interval Note:  12/26/2014 10:09 AM  Jamie [name]  has presented today for surgery, with the diagnosis of SYMPTOMATIC GALLSTONES, CHRONIC CHOLECYSTITIS.  The various methods of treatment have been discussed with the patient and family. After consideration of risks, benefits and other options for treatment, the patient has consented to    Procedure(s): LAPAROSCOPIC CHOLECYSTECTOMY WITH INTRAOPERATIVE CHOLANGIOGRAM (N/A) as a surgical intervention .    The patient's history has been reviewed, patient examined, no change in status, stable for surgery.  I have reviewed the patient's chart and labs.  Questions were answered to the patient's satisfaction.    Earnstine Regal, MD, [facility] Office: [place]

## 2014-12-26 NOTE — Anesthesia Postprocedure Evaluation (Signed)
  Anesthesia Post-op Note  Patient: Jamie [name]  Procedure(s) Performed: Procedure(s): LAPAROSCOPIC CHOLECYSTECTOMY WITH INTRAOPERATIVE CHOLANGIOGRAM (N/A)  Patient Location: PACU  Anesthesia Type:General  Level of Consciousness: awake  Airway and Oxygen Therapy: Patient Spontanous Breathing  Post-op Pain: mild  Post-op Assessment: Post-op Vital signs reviewed, Patient's Cardiovascular Status Stable, Respiratory Function Stable, Patent Airway and No signs of Nausea or vomiting              Post-op Vital Signs: Reviewed and stable  Last Vitals:  Filed Vitals:   12/26/14 1231  BP: 118/85  Pulse:   Temp: 36.6 C  Resp: 17    Complications: No apparent anesthesia complications

## 2014-12-26 NOTE — Progress Notes (Signed)
Dr. Tresa Moore notified of patients pain level remaining significant after 2mg  IV Dilaudid - ordered 30mg IV Toradol once.

## 2014-12-26 NOTE — Transfer of Care (Signed)
Immediate Anesthesia Transfer of Care Note  Patient: Jamie [name]  Procedure(s) Performed: Procedure(s): LAPAROSCOPIC CHOLECYSTECTOMY WITH INTRAOPERATIVE CHOLANGIOGRAM (N/A)  Patient Location: PACU  Anesthesia Type:General  Level of Consciousness: awake, oriented and patient cooperative  Airway & Oxygen Therapy: Patient Spontanous Breathing and Patient connected to nasal cannula oxygen  Post-op Assessment: Report given to RN and Post -op Vital signs reviewed and stable  Post vital signs: Reviewed  Last Vitals:  Filed Vitals:   12/26/14 1231  BP: 118/85  Pulse:   Temp: 36.6 C  Resp: 17    Complications: No apparent anesthesia complications

## 2014-12-26 NOTE — Op Note (Signed)
Procedure Note  Pre-operative Diagnosis:  Chronic cholecystitis, cholelithiasis, abdominal pain  Post-operative Diagnosis:  same  Surgeon:  Earnstine Regal, MD, FACS  Assistant:  Autumn Messing, MD, FACS   Procedure:  Laparoscopic cholecystectomy with intra-operative cholangiography  Anesthesia:  General  Estimated Blood Loss:  minimal  Drains: none         Specimen: Gallbladder to pathology  Indications:  The patient is a 49 year old female who presents for evaluation of gall stones. Patient is referred by Dr. Wonda Horner for surgical treatment of symptomatic cholelithiasis and chronic cholecystitis. Patient presented in the spring with abdominal pain. This was initially intermittent. Pain has become more constant and more severe over the past 2 weeks. She has had nausea and vomiting. She experiences chills. She has been using Phenergan suppositories for control of symptoms. Patient notes that pretty much all foods exacerbate her pain. She denies any history of jaundice or acholic stools. She has had problems with constipation. Evaluation has included abdominal ultrasound which shows gallbladder sludge and small gallstones. Patient underwent an MRI scan to rule out a pancreatic mass and this study also confirmed multiple gallstones without choledocholithiasis. Patient has had an upper endoscopy showing esophagitis. Previous abdominal surgery includes gynecologic procedures only. Patient now presents for consideration for cholecystectomy for symptomatic cholelithiasis, abdominal pain, and probable chronic cholecystitis. Patient does have a history of liver disease and possible hepatitis C.  Procedure Details:  The patient was seen in the pre-op holding area. The risks, benefits, complications, treatment options, and expected outcomes were previously discussed with the patient. The patient agreed with the proposed plan and has signed the informed consent form.  The patient was brought  to the Operating Room, identified as Jamie [name] and the procedure verified as laparoscopic cholecystectomy with intraoperative cholangiography. A "time out" was completed and the above information confirmed.  The patient was placed in the supine position. Following induction of general anesthesia, the abdomen was prepped and draped in the usual aseptic fashion.  An incision was made in the skin near the umbilicus. The midline fascia was incised and the peritoneal cavity was entered and a Hasson canula was introduced under direct vision.  The Hasson canula was secured with a 0-Vicryl pursestring suture. Pneumoperitoneum was established with carbon dioxide. Additional trocars were introduced under direct vision along the right costal margin in the midline, mid-clavicular line, and anterior axillary line.   The gallbladder was identified and the fundus grasped and retracted cephalad. Adhesions were taken down bluntly and the electrocautery was utilized as needed, taking care not to injure any adjacent structures. The infundibulum was grasped and retracted laterally, exposing the peritoneum overlying the triangle of Calot. The peritoneum was incised and structures exposed with blunt dissection. The cystic duct was clearly identified, bluntly dissected circumferentially, and clipped at the neck of the gallbladder.  An incision was made in the cystic duct and the cholangiogram catheter introduced. The catheter was secured using an ligaclip.  Real-time cholangiography was performed using C-arm fluoroscopy.  There was rapid filling of a normal caliber common bile duct.  There was reflux of contrast into the left and right hepatic ductal systems.  There was free flow distally into the duodenum without filling defect or obstruction.  The catheter was removed from the peritoneal cavity.  The cystic duct was then ligated with surgical clips and divided. The cystic artery was identified, dissected  circumferentially, ligated with ligaclips, and divided.  The gallbladder was dissected away from the  liver bed using the electrocautery for hemostasis. The gallbladder was completely removed from the liver and placed into an endocatch bag. The gallbladder was removed in the endocatch bag through the umbilical port site and submitted to pathology for review.  The right upper quadrant was irrigated and the gallbladder bed was inspected. Hemostasis was achieved with the electrocautery.  Pneumoperitoneum was released after viewing removal of the trocars with good hemostasis noted. The umbilical wound was irrigated and the fascia was then closed with the pursestring suture.  Local anesthetic was infiltrated at all port sites. The skin incisions were closed with 4-0 Monocril subcuticular sutures and steri-strips and dressings were applied.  Instrument, sponge, and needle counts were correct at the conclusion of the case.  The patient was awakened from anesthesia and brought to the recovery room in stable condition.  The patient tolerated the procedure well.   Earnstine Regal, MD, [facility] Office: [phone]

## 2014-12-27 ENCOUNTER — Encounter (HOSPITAL_COMMUNITY): Payer: Self-pay | Admitting: Surgery

## 2014-12-27 DIAGNOSIS — K7460 Unspecified cirrhosis of liver: Secondary | ICD-10-CM | POA: Diagnosis not present

## 2014-12-27 DIAGNOSIS — K8010 Calculus of gallbladder with chronic cholecystitis without obstruction: Secondary | ICD-10-CM | POA: Diagnosis not present

## 2014-12-27 DIAGNOSIS — F419 Anxiety disorder, unspecified: Secondary | ICD-10-CM | POA: Diagnosis not present

## 2014-12-27 MED ORDER — LORAZEPAM 1 MG PO TABS: 1.0000 mg | ORAL_TABLET | Freq: Three times a day (TID) | ORAL | Status: DC | PRN

## 2014-12-27 MED ORDER — ALUM & MAG HYDROXIDE-SIMETH 200-200-20 MG/5ML PO SUSP
30.0000 mL | Freq: Four times a day (QID) | ORAL | Status: DC | PRN
  Administered 2014-12-27: 30 mL via ORAL
  Filled 2014-12-27: qty 30

## 2014-12-27 MED ORDER — OXYCODONE-ACETAMINOPHEN 5-325 MG PO TABS: 1.0000 | ORAL_TABLET | Freq: Four times a day (QID) | ORAL | Status: DC | PRN

## 2014-12-27 NOTE — Discharge Summary (Signed)
Physician Discharge Summary [facility]  Patient ID: Jamie [name] MRN: [id number] DOB/AGE: 1965/08/27 49 y.o.  Admit date: 12/26/2014 Discharge date: 12/27/2014  Admission Diagnoses:  Chronic cholecystitis, cholelithiasis  Discharge Diagnoses:  Principal Problem:   Chronic cholecystitis with calculus   Discharged Condition: good  Hospital Course: Patient was admitted for observation following gallbladder surgery.  Post op course was uncomplicated.  Pain was well controlled.  Tolerated diet.  Pain well controlled.  Patient requested stronger pain Rx and Ativan for anxiety.  Limited quantities provided.  Patient was prepared for discharge home on POD#1.  Consults: None  Treatments: surgery: lap chole with IOC  Discharge Exam: Blood pressure 125/90, pulse 80, temperature 98.1 F (36.7 C), temperature source Oral, resp. rate 16, height 5\' 4"  (1.626 m), weight 62.596 kg (138 lb), SpO2 100 %. HEENT - clear Neck - soft Chest - clear bilaterally Cor - RRR Abd - dressings dry and intact, mild distension, BS present  Disposition: Home  Discharge Instructions    Diet - low sodium heart healthy    Complete by:  As directed      Discharge instructions    Complete by:  As directed   [facility]  LAPAROSCOPIC SURGERY:  POST-OP INSTRUCTIONS  Always review your discharge instruction sheet given to you by the facility where your surgery was performed.  A prescription for pain medication may be given to you upon discharge.  Take your pain medication as prescribed.  If narcotic pain medicine is not needed, then you may take acetaminophen (Tylenol) or ibuprofen (Advil) as needed.  Take your usually prescribed medications unless otherwise directed.  If you need a refill on your pain medication, please contact your pharmacy.  They will contact our office to request authorization. Prescriptions will not be filled after 5 P.M. or on weekends.  You  should follow a light diet the first few days after arrival home, such as soup and crackers or toast.  Be sure to include plenty of fluids daily.  Most patients will experience some swelling and bruising in the area of the incisions.  Ice packs will help.  Swelling and bruising can take several days to resolve.   It is common to experience some constipation after surgery.  Increasing fluid intake and taking a stool softener (such as Colace) will usually help or prevent this problem from occurring.  A mild laxative (Milk of Magnesia or Miralax) should be taken according to package instructions if there has been no bowel movement after 48 hours.  You will have steri-strips and a gauze dressing over your incisions.  You may remove the gauze bandage on the second day after surgery, and you may shower at that time.  Leave your steri-strips (small skin tapes) in place directly over the incision.  These strips should remain on the skin for 5-7 days and then be removed.  You may get them wet in the shower and pat them dry.  Any sutures or staples will be removed at the office during your follow-up visit.  ACTIVITIES:  You may resume regular (light) daily activities beginning the next day - such as daily self-care, walking, climbing stairs - gradually increasing activities as tolerated.  You may have sexual intercourse when it is comfortable.  Refrain from any heavy lifting or straining until approved by your doctor.  You may drive when you are no longer taking prescription pain medication, you can comfortably wear a seatbelt, and you can safely maneuver  your car and apply brakes.  You should see your doctor in the office for a follow-up appointment approximately 2-3 weeks after your surgery.  Make sure that you call for this appointment within a day or two after you arrive home to insure a convenient appointment time.  WHEN TO CALL YOUR DOCTOR: Fever over 101.0 Inability to urinate Continued bleeding from  incision Increased pain, redness, or drainage from the incision Increasing abdominal pain  The clinic staff is available to answer your questions during regular business hours.  Please don't hesitate to call and ask to speak to one of the nurses for clinical concerns.  If you have a medical emergency, go to the nearest emergency room or call 911.  A surgeon from [facility] is always on call for the hospital.  Earnstine Regal, MD, [facility] Office: [place] Free:  [place] [phone]  Website: [email or web address]     Increase activity slowly    Complete by:  As directed      Remove dressing in 24 hours    Complete by:  As directed             Medication List    TAKE these medications        HYDROcodone-acetaminophen 5-325 MG per tablet  Commonly known as:  NORCO/VICODIN  Take 1-2 tablets by mouth every 4 (four) hours as needed for moderate pain.     LORazepam 1 MG tablet  Commonly known as:  ATIVAN  Take 1 tablet (1 mg total) by mouth every 8 (eight) hours as needed for anxiety.     omeprazole 20 MG capsule  Commonly known as:  PRILOSEC  Take 20 mg by mouth daily.     oxyCODONE-acetaminophen 5-325 MG per tablet  Commonly known as:  ROXICET  Take 1-2 tablets by mouth every 6 (six) hours as needed for moderate pain.           Follow-up Information    Follow up with Earnstine Regal, MD. Schedule an appointment as soon as possible for a visit in 3 weeks.   Specialty:  General Surgery   Why:  For wound re-check   Contact information:   [place] [phone] [phone]       Earnstine Regal, MD, [facility] Office: [phone]   Signed: Earnstine Regal 12/27/2014, 9:17 AM

## 2015-01-05 DIAGNOSIS — R109 Unspecified abdominal pain: Secondary | ICD-10-CM | POA: Diagnosis not present

## 2015-01-05 DIAGNOSIS — R110 Nausea: Secondary | ICD-10-CM | POA: Diagnosis not present

## 2015-01-05 DIAGNOSIS — R1033 Periumbilical pain: Secondary | ICD-10-CM | POA: Diagnosis not present

## 2015-01-05 DIAGNOSIS — Z9049 Acquired absence of other specified parts of digestive tract: Secondary | ICD-10-CM | POA: Diagnosis not present

## 2015-02-03 DIAGNOSIS — M7989 Other specified soft tissue disorders: Secondary | ICD-10-CM | POA: Diagnosis not present

## 2015-02-03 DIAGNOSIS — M25552 Pain in left hip: Secondary | ICD-10-CM | POA: Diagnosis not present

## 2015-02-03 DIAGNOSIS — M545 Low back pain: Secondary | ICD-10-CM | POA: Diagnosis not present

## 2015-02-03 DIAGNOSIS — S7012XA Contusion of left thigh, initial encounter: Secondary | ICD-10-CM | POA: Diagnosis not present

## 2015-02-03 DIAGNOSIS — S7002XA Contusion of left hip, initial encounter: Secondary | ICD-10-CM | POA: Diagnosis not present

## 2015-02-16 DIAGNOSIS — M25552 Pain in left hip: Secondary | ICD-10-CM | POA: Diagnosis not present

## 2015-02-16 DIAGNOSIS — R0602 Shortness of breath: Secondary | ICD-10-CM | POA: Diagnosis not present

## 2015-02-16 DIAGNOSIS — M7989 Other specified soft tissue disorders: Secondary | ICD-10-CM | POA: Diagnosis not present

## 2015-02-19 DIAGNOSIS — L02413 Cutaneous abscess of right upper limb: Secondary | ICD-10-CM | POA: Diagnosis not present

## 2015-02-19 DIAGNOSIS — S8002XA Contusion of left knee, initial encounter: Secondary | ICD-10-CM | POA: Diagnosis not present

## 2015-02-19 DIAGNOSIS — R58 Hemorrhage, not elsewhere classified: Secondary | ICD-10-CM | POA: Diagnosis not present

## 2015-02-25 DIAGNOSIS — R609 Edema, unspecified: Secondary | ICD-10-CM | POA: Diagnosis not present

## 2015-02-25 DIAGNOSIS — T24302A Burn of third degree of unspecified site of left lower limb, except ankle and foot, initial encounter: Secondary | ICD-10-CM | POA: Diagnosis not present

## 2015-02-25 DIAGNOSIS — Z6824 Body mass index (BMI) 24.0-24.9, adult: Secondary | ICD-10-CM | POA: Diagnosis not present

## 2015-02-27 DIAGNOSIS — Z6824 Body mass index (BMI) 24.0-24.9, adult: Secondary | ICD-10-CM | POA: Diagnosis not present

## 2015-02-27 DIAGNOSIS — T24302A Burn of third degree of unspecified site of left lower limb, except ankle and foot, initial encounter: Secondary | ICD-10-CM | POA: Diagnosis not present

## 2015-03-03 DIAGNOSIS — T310 Burns involving less than 10% of body surface: Secondary | ICD-10-CM | POA: Diagnosis not present

## 2015-03-03 DIAGNOSIS — Z8619 Personal history of other infectious and parasitic diseases: Secondary | ICD-10-CM | POA: Diagnosis not present

## 2015-03-03 DIAGNOSIS — K7460 Unspecified cirrhosis of liver: Secondary | ICD-10-CM | POA: Diagnosis not present

## 2015-03-03 DIAGNOSIS — L97129 Non-pressure chronic ulcer of left thigh with unspecified severity: Secondary | ICD-10-CM | POA: Diagnosis not present

## 2015-03-03 DIAGNOSIS — T24212A Burn of second degree of left thigh, initial encounter: Secondary | ICD-10-CM | POA: Diagnosis not present

## 2015-03-03 DIAGNOSIS — F40240 Claustrophobia: Secondary | ICD-10-CM | POA: Diagnosis not present

## 2015-03-05 DIAGNOSIS — S81002A Unspecified open wound, left knee, initial encounter: Secondary | ICD-10-CM | POA: Diagnosis not present

## 2015-03-05 DIAGNOSIS — L97129 Non-pressure chronic ulcer of left thigh with unspecified severity: Secondary | ICD-10-CM | POA: Diagnosis not present

## 2015-03-05 DIAGNOSIS — T310 Burns involving less than 10% of body surface: Secondary | ICD-10-CM | POA: Diagnosis not present

## 2015-03-11 DIAGNOSIS — T24312D Burn of third degree of left thigh, subsequent encounter: Secondary | ICD-10-CM | POA: Diagnosis not present

## 2015-03-11 DIAGNOSIS — L97129 Non-pressure chronic ulcer of left thigh with unspecified severity: Secondary | ICD-10-CM | POA: Diagnosis not present

## 2015-03-11 DIAGNOSIS — T24312A Burn of third degree of left thigh, initial encounter: Secondary | ICD-10-CM | POA: Diagnosis not present

## 2015-03-11 DIAGNOSIS — T310 Burns involving less than 10% of body surface: Secondary | ICD-10-CM | POA: Diagnosis not present

## 2015-03-21 DIAGNOSIS — L97129 Non-pressure chronic ulcer of left thigh with unspecified severity: Secondary | ICD-10-CM | POA: Diagnosis not present

## 2015-03-21 DIAGNOSIS — T24312D Burn of third degree of left thigh, subsequent encounter: Secondary | ICD-10-CM | POA: Diagnosis not present

## 2015-03-21 DIAGNOSIS — T310 Burns involving less than 10% of body surface: Secondary | ICD-10-CM | POA: Diagnosis not present

## 2015-03-25 DIAGNOSIS — R109 Unspecified abdominal pain: Secondary | ICD-10-CM | POA: Diagnosis not present

## 2015-03-25 DIAGNOSIS — Z1211 Encounter for screening for malignant neoplasm of colon: Secondary | ICD-10-CM | POA: Diagnosis not present

## 2015-03-25 DIAGNOSIS — K7460 Unspecified cirrhosis of liver: Secondary | ICD-10-CM | POA: Diagnosis not present

## 2015-03-25 DIAGNOSIS — B1920 Unspecified viral hepatitis C without hepatic coma: Secondary | ICD-10-CM | POA: Diagnosis not present

## 2015-03-26 DIAGNOSIS — T310 Burns involving less than 10% of body surface: Secondary | ICD-10-CM | POA: Diagnosis not present

## 2015-03-26 DIAGNOSIS — L97129 Non-pressure chronic ulcer of left thigh with unspecified severity: Secondary | ICD-10-CM | POA: Diagnosis not present

## 2015-03-26 DIAGNOSIS — T24312D Burn of third degree of left thigh, subsequent encounter: Secondary | ICD-10-CM | POA: Diagnosis not present

## 2015-03-28 DIAGNOSIS — T310 Burns involving less than 10% of body surface: Secondary | ICD-10-CM | POA: Diagnosis not present

## 2015-03-28 DIAGNOSIS — T24312D Burn of third degree of left thigh, subsequent encounter: Secondary | ICD-10-CM | POA: Diagnosis not present

## 2015-03-30 ENCOUNTER — Encounter (HOSPITAL_COMMUNITY): Payer: Self-pay | Admitting: *Deleted

## 2015-03-31 DIAGNOSIS — T24312D Burn of third degree of left thigh, subsequent encounter: Secondary | ICD-10-CM | POA: Diagnosis not present

## 2015-03-31 DIAGNOSIS — T310 Burns involving less than 10% of body surface: Secondary | ICD-10-CM | POA: Diagnosis not present

## 2015-04-29 DIAGNOSIS — T24312D Burn of third degree of left thigh, subsequent encounter: Secondary | ICD-10-CM | POA: Diagnosis not present

## 2015-04-29 DIAGNOSIS — T24312A Burn of third degree of left thigh, initial encounter: Secondary | ICD-10-CM | POA: Diagnosis not present

## 2015-04-29 DIAGNOSIS — T310 Burns involving less than 10% of body surface: Secondary | ICD-10-CM | POA: Diagnosis not present

## 2015-04-29 DIAGNOSIS — L97129 Non-pressure chronic ulcer of left thigh with unspecified severity: Secondary | ICD-10-CM | POA: Diagnosis not present

## 2015-05-21 DIAGNOSIS — L97121 Non-pressure chronic ulcer of left thigh limited to breakdown of skin: Secondary | ICD-10-CM | POA: Diagnosis not present

## 2015-05-21 DIAGNOSIS — Z09 Encounter for follow-up examination after completed treatment for conditions other than malignant neoplasm: Secondary | ICD-10-CM | POA: Diagnosis not present

## 2015-05-21 DIAGNOSIS — T310 Burns involving less than 10% of body surface: Secondary | ICD-10-CM | POA: Diagnosis not present

## 2015-05-21 DIAGNOSIS — Z872 Personal history of diseases of the skin and subcutaneous tissue: Secondary | ICD-10-CM | POA: Diagnosis not present

## 2016-01-03 DIAGNOSIS — L0390 Cellulitis, unspecified: Secondary | ICD-10-CM | POA: Diagnosis not present

## 2016-01-28 DIAGNOSIS — S60511A Abrasion of right hand, initial encounter: Secondary | ICD-10-CM | POA: Diagnosis not present

## 2016-01-28 DIAGNOSIS — W5503XA Scratched by cat, initial encounter: Secondary | ICD-10-CM | POA: Diagnosis not present

## 2016-02-24 ENCOUNTER — Other Ambulatory Visit: Payer: Self-pay | Admitting: Gastroenterology

## 2016-02-24 DIAGNOSIS — R140 Abdominal distension (gaseous): Secondary | ICD-10-CM

## 2016-02-24 DIAGNOSIS — Z1211 Encounter for screening for malignant neoplasm of colon: Secondary | ICD-10-CM | POA: Diagnosis not present

## 2016-02-24 DIAGNOSIS — K7460 Unspecified cirrhosis of liver: Secondary | ICD-10-CM | POA: Diagnosis not present

## 2016-02-24 DIAGNOSIS — B1920 Unspecified viral hepatitis C without hepatic coma: Secondary | ICD-10-CM | POA: Diagnosis not present

## 2016-03-02 ENCOUNTER — Ambulatory Visit
Admission: RE | Admit: 2016-03-02 | Discharge: 2016-03-02 | Disposition: A | Discharge status: Home / Self Care | Payer: Medicare Other | Source: Ambulatory Visit | Attending: Gastroenterology | Admitting: Gastroenterology

## 2016-03-02 DIAGNOSIS — K7460 Unspecified cirrhosis of liver: Secondary | ICD-10-CM | POA: Diagnosis not present

## 2016-03-02 DIAGNOSIS — R140 Abdominal distension (gaseous): Secondary | ICD-10-CM

## 2016-04-13 DIAGNOSIS — K7460 Unspecified cirrhosis of liver: Secondary | ICD-10-CM | POA: Diagnosis not present

## 2016-04-13 DIAGNOSIS — R112 Nausea with vomiting, unspecified: Secondary | ICD-10-CM | POA: Diagnosis not present

## 2016-04-13 DIAGNOSIS — B1920 Unspecified viral hepatitis C without hepatic coma: Secondary | ICD-10-CM | POA: Diagnosis not present

## 2016-04-13 DIAGNOSIS — Z1211 Encounter for screening for malignant neoplasm of colon: Secondary | ICD-10-CM | POA: Diagnosis not present

## 2016-08-02 DIAGNOSIS — R1010 Upper abdominal pain, unspecified: Secondary | ICD-10-CM | POA: Diagnosis not present

## 2016-08-02 DIAGNOSIS — K529 Noninfective gastroenteritis and colitis, unspecified: Secondary | ICD-10-CM | POA: Diagnosis not present

## 2016-08-02 DIAGNOSIS — N390 Urinary tract infection, site not specified: Secondary | ICD-10-CM | POA: Diagnosis not present

## 2016-11-15 DIAGNOSIS — Z79899 Other long term (current) drug therapy: Secondary | ICD-10-CM | POA: Diagnosis not present

## 2016-11-15 DIAGNOSIS — S3992XA Unspecified injury of lower back, initial encounter: Secondary | ICD-10-CM | POA: Diagnosis not present

## 2016-11-15 DIAGNOSIS — M545 Low back pain: Secondary | ICD-10-CM | POA: Diagnosis not present

## 2016-11-15 DIAGNOSIS — S335XXA Sprain of ligaments of lumbar spine, initial encounter: Secondary | ICD-10-CM | POA: Diagnosis not present

## 2016-11-15 DIAGNOSIS — S339XXA Sprain of unspecified parts of lumbar spine and pelvis, initial encounter: Secondary | ICD-10-CM | POA: Diagnosis not present

## 2016-11-15 DIAGNOSIS — S199XXA Unspecified injury of neck, initial encounter: Secondary | ICD-10-CM | POA: Diagnosis not present

## 2016-11-15 DIAGNOSIS — R112 Nausea with vomiting, unspecified: Secondary | ICD-10-CM | POA: Diagnosis not present

## 2016-11-24 DIAGNOSIS — S32049A Unspecified fracture of fourth lumbar vertebra, initial encounter for closed fracture: Secondary | ICD-10-CM

## 2016-11-24 HISTORY — DX: Unspecified fracture of fourth lumbar vertebra, initial encounter for closed fracture: S32.049A

## 2016-11-30 DIAGNOSIS — K449 Diaphragmatic hernia without obstruction or gangrene: Secondary | ICD-10-CM | POA: Diagnosis not present

## 2016-11-30 DIAGNOSIS — Z8619 Personal history of other infectious and parasitic diseases: Secondary | ICD-10-CM | POA: Diagnosis not present

## 2016-11-30 DIAGNOSIS — T1490XA Injury, unspecified, initial encounter: Secondary | ICD-10-CM | POA: Diagnosis not present

## 2016-11-30 DIAGNOSIS — S0101XA Laceration without foreign body of scalp, initial encounter: Secondary | ICD-10-CM | POA: Diagnosis not present

## 2016-11-30 DIAGNOSIS — K760 Fatty (change of) liver, not elsewhere classified: Secondary | ICD-10-CM | POA: Diagnosis not present

## 2016-11-30 DIAGNOSIS — R51 Headache: Secondary | ICD-10-CM | POA: Diagnosis not present

## 2016-11-30 DIAGNOSIS — K219 Gastro-esophageal reflux disease without esophagitis: Secondary | ICD-10-CM | POA: Diagnosis not present

## 2016-11-30 DIAGNOSIS — Z048 Encounter for examination and observation for other specified reasons: Secondary | ICD-10-CM | POA: Diagnosis not present

## 2016-11-30 DIAGNOSIS — Z79899 Other long term (current) drug therapy: Secondary | ICD-10-CM | POA: Diagnosis not present

## 2016-11-30 DIAGNOSIS — F319 Bipolar disorder, unspecified: Secondary | ICD-10-CM | POA: Diagnosis not present

## 2016-11-30 DIAGNOSIS — R0781 Pleurodynia: Secondary | ICD-10-CM | POA: Diagnosis not present

## 2016-11-30 DIAGNOSIS — R609 Edema, unspecified: Secondary | ICD-10-CM | POA: Diagnosis not present

## 2016-11-30 DIAGNOSIS — R102 Pelvic and perineal pain: Secondary | ICD-10-CM | POA: Diagnosis not present

## 2016-11-30 DIAGNOSIS — M542 Cervicalgia: Secondary | ICD-10-CM | POA: Diagnosis not present

## 2016-11-30 DIAGNOSIS — S300XXA Contusion of lower back and pelvis, initial encounter: Secondary | ICD-10-CM | POA: Diagnosis not present

## 2016-11-30 DIAGNOSIS — S8992XA Unspecified injury of left lower leg, initial encounter: Secondary | ICD-10-CM | POA: Diagnosis not present

## 2016-12-01 DIAGNOSIS — Z79899 Other long term (current) drug therapy: Secondary | ICD-10-CM | POA: Diagnosis not present

## 2016-12-01 DIAGNOSIS — K219 Gastro-esophageal reflux disease without esophagitis: Secondary | ICD-10-CM | POA: Diagnosis not present

## 2016-12-01 DIAGNOSIS — F319 Bipolar disorder, unspecified: Secondary | ICD-10-CM | POA: Diagnosis not present

## 2016-12-01 DIAGNOSIS — S7002XA Contusion of left hip, initial encounter: Secondary | ICD-10-CM | POA: Diagnosis not present

## 2016-12-12 DIAGNOSIS — S301XXA Contusion of abdominal wall, initial encounter: Secondary | ICD-10-CM | POA: Diagnosis not present

## 2016-12-12 DIAGNOSIS — S0990XA Unspecified injury of head, initial encounter: Secondary | ICD-10-CM | POA: Diagnosis not present

## 2016-12-12 DIAGNOSIS — S2231XA Fracture of one rib, right side, initial encounter for closed fracture: Secondary | ICD-10-CM | POA: Diagnosis not present

## 2016-12-12 DIAGNOSIS — S300XXA Contusion of lower back and pelvis, initial encounter: Secondary | ICD-10-CM | POA: Diagnosis not present

## 2016-12-12 DIAGNOSIS — S32000A Wedge compression fracture of unspecified lumbar vertebra, initial encounter for closed fracture: Secondary | ICD-10-CM | POA: Diagnosis not present

## 2016-12-12 DIAGNOSIS — S199XXA Unspecified injury of neck, initial encounter: Secondary | ICD-10-CM | POA: Diagnosis not present

## 2016-12-12 DIAGNOSIS — S30810A Abrasion of lower back and pelvis, initial encounter: Secondary | ICD-10-CM | POA: Diagnosis not present

## 2016-12-12 DIAGNOSIS — R109 Unspecified abdominal pain: Secondary | ICD-10-CM | POA: Diagnosis not present

## 2016-12-12 DIAGNOSIS — M4850XA Collapsed vertebra, not elsewhere classified, site unspecified, initial encounter for fracture: Secondary | ICD-10-CM | POA: Diagnosis not present

## 2016-12-13 DIAGNOSIS — S301XXA Contusion of abdominal wall, initial encounter: Secondary | ICD-10-CM | POA: Diagnosis not present

## 2016-12-13 DIAGNOSIS — S2231XA Fracture of one rib, right side, initial encounter for closed fracture: Secondary | ICD-10-CM | POA: Diagnosis not present

## 2016-12-24 DIAGNOSIS — S300XXA Contusion of lower back and pelvis, initial encounter: Secondary | ICD-10-CM | POA: Diagnosis not present

## 2016-12-24 DIAGNOSIS — M545 Low back pain: Secondary | ICD-10-CM | POA: Diagnosis not present

## 2017-01-11 DIAGNOSIS — M549 Dorsalgia, unspecified: Secondary | ICD-10-CM | POA: Diagnosis not present

## 2017-01-11 DIAGNOSIS — Z6824 Body mass index (BMI) 24.0-24.9, adult: Secondary | ICD-10-CM | POA: Diagnosis not present

## 2017-01-18 DIAGNOSIS — S32000A Wedge compression fracture of unspecified lumbar vertebra, initial encounter for closed fracture: Secondary | ICD-10-CM | POA: Diagnosis not present

## 2017-02-15 DIAGNOSIS — I10 Essential (primary) hypertension: Secondary | ICD-10-CM | POA: Diagnosis not present

## 2017-02-15 DIAGNOSIS — Z79899 Other long term (current) drug therapy: Secondary | ICD-10-CM | POA: Diagnosis not present

## 2017-02-15 DIAGNOSIS — Z6824 Body mass index (BMI) 24.0-24.9, adult: Secondary | ICD-10-CM | POA: Diagnosis not present

## 2017-02-16 DIAGNOSIS — Z6824 Body mass index (BMI) 24.0-24.9, adult: Secondary | ICD-10-CM | POA: Diagnosis not present

## 2017-02-16 DIAGNOSIS — M545 Low back pain: Secondary | ICD-10-CM | POA: Diagnosis not present

## 2017-02-16 DIAGNOSIS — M62830 Muscle spasm of back: Secondary | ICD-10-CM | POA: Diagnosis not present

## 2017-03-01 DIAGNOSIS — S32000A Wedge compression fracture of unspecified lumbar vertebra, initial encounter for closed fracture: Secondary | ICD-10-CM | POA: Diagnosis not present

## 2017-04-10 DIAGNOSIS — R05 Cough: Secondary | ICD-10-CM | POA: Diagnosis not present

## 2017-04-10 DIAGNOSIS — Z79899 Other long term (current) drug therapy: Secondary | ICD-10-CM | POA: Diagnosis not present

## 2017-04-10 DIAGNOSIS — B349 Viral infection, unspecified: Secondary | ICD-10-CM | POA: Diagnosis not present

## 2017-04-10 DIAGNOSIS — Z792 Long term (current) use of antibiotics: Secondary | ICD-10-CM | POA: Diagnosis not present

## 2017-04-15 ENCOUNTER — Other Ambulatory Visit: Payer: Self-pay | Admitting: Physician Assistant

## 2017-04-15 DIAGNOSIS — R1013 Epigastric pain: Secondary | ICD-10-CM

## 2017-04-15 DIAGNOSIS — K625 Hemorrhage of anus and rectum: Secondary | ICD-10-CM | POA: Diagnosis not present

## 2017-04-15 DIAGNOSIS — K5900 Constipation, unspecified: Secondary | ICD-10-CM | POA: Diagnosis not present

## 2017-04-15 DIAGNOSIS — K7460 Unspecified cirrhosis of liver: Secondary | ICD-10-CM | POA: Diagnosis not present

## 2017-04-15 DIAGNOSIS — J40 Bronchitis, not specified as acute or chronic: Secondary | ICD-10-CM | POA: Diagnosis not present

## 2017-04-20 ENCOUNTER — Encounter (HOSPITAL_COMMUNITY): Payer: Self-pay | Admitting: *Deleted

## 2017-04-20 ENCOUNTER — Emergency Department (HOSPITAL_COMMUNITY): Payer: Medicare Other

## 2017-04-20 ENCOUNTER — Inpatient Hospital Stay
Admission: RE | Admit: 2017-04-20 | Discharge: 2017-04-20 | Disposition: A | Discharge status: Home / Self Care | Payer: Self-pay | Source: Ambulatory Visit | Attending: Physician Assistant | Admitting: Physician Assistant

## 2017-04-20 ENCOUNTER — Other Ambulatory Visit: Payer: Self-pay

## 2017-04-20 ENCOUNTER — Inpatient Hospital Stay (HOSPITAL_COMMUNITY)
Admission: EM | Admit: 2017-04-20 | Discharge: 2017-04-23 | DRG: 203 | Disposition: A | Discharge status: Home / Self Care | Payer: Medicare Other | Attending: Oncology | Admitting: Oncology

## 2017-04-20 DIAGNOSIS — J40 Bronchitis, not specified as acute or chronic: Secondary | ICD-10-CM | POA: Diagnosis not present

## 2017-04-20 DIAGNOSIS — R079 Chest pain, unspecified: Secondary | ICD-10-CM | POA: Diagnosis not present

## 2017-04-20 DIAGNOSIS — F319 Bipolar disorder, unspecified: Secondary | ICD-10-CM | POA: Diagnosis present

## 2017-04-20 DIAGNOSIS — F1110 Opioid abuse, uncomplicated: Secondary | ICD-10-CM | POA: Diagnosis present

## 2017-04-20 DIAGNOSIS — Z91410 Personal history of adult physical and sexual abuse: Secondary | ICD-10-CM

## 2017-04-20 DIAGNOSIS — J209 Acute bronchitis, unspecified: Principal | ICD-10-CM | POA: Diagnosis not present

## 2017-04-20 DIAGNOSIS — K7460 Unspecified cirrhosis of liver: Secondary | ICD-10-CM | POA: Diagnosis present

## 2017-04-20 DIAGNOSIS — K219 Gastro-esophageal reflux disease without esophagitis: Secondary | ICD-10-CM | POA: Diagnosis present

## 2017-04-20 DIAGNOSIS — K7030 Alcoholic cirrhosis of liver without ascites: Secondary | ICD-10-CM | POA: Diagnosis present

## 2017-04-20 DIAGNOSIS — R000 Tachycardia, unspecified: Secondary | ICD-10-CM | POA: Diagnosis present

## 2017-04-20 DIAGNOSIS — E876 Hypokalemia: Secondary | ICD-10-CM | POA: Diagnosis present

## 2017-04-20 DIAGNOSIS — R0602 Shortness of breath: Secondary | ICD-10-CM | POA: Diagnosis not present

## 2017-04-20 DIAGNOSIS — J189 Pneumonia, unspecified organism: Secondary | ICD-10-CM | POA: Diagnosis not present

## 2017-04-20 DIAGNOSIS — I10 Essential (primary) hypertension: Secondary | ICD-10-CM | POA: Diagnosis present

## 2017-04-20 DIAGNOSIS — J988 Other specified respiratory disorders: Secondary | ICD-10-CM | POA: Diagnosis not present

## 2017-04-20 DIAGNOSIS — F419 Anxiety disorder, unspecified: Secondary | ICD-10-CM | POA: Diagnosis present

## 2017-04-20 DIAGNOSIS — Z6824 Body mass index (BMI) 24.0-24.9, adult: Secondary | ICD-10-CM | POA: Diagnosis not present

## 2017-04-20 DIAGNOSIS — Z86711 Personal history of pulmonary embolism: Secondary | ICD-10-CM

## 2017-04-20 DIAGNOSIS — Z79899 Other long term (current) drug therapy: Secondary | ICD-10-CM

## 2017-04-20 HISTORY — DX: Unspecified viral hepatitis C without hepatic coma: B19.20

## 2017-04-20 HISTORY — DX: Headache, unspecified: R51.9

## 2017-04-20 HISTORY — DX: Unspecified fracture of fourth lumbar vertebra, initial encounter for closed fracture: S32.049A

## 2017-04-20 HISTORY — DX: Anxiety disorder, unspecified: F41.9

## 2017-04-20 HISTORY — DX: Headache: R51

## 2017-04-20 HISTORY — DX: Pneumonia, unspecified organism: J18.9

## 2017-04-20 LAB — COMPREHENSIVE METABOLIC PANEL
ALT: 25 U/L (ref 14–54)
AST: 87 U/L — ABNORMAL HIGH (ref 15–41)
Albumin: 2.8 g/dL — ABNORMAL LOW (ref 3.5–5.0)
Alkaline Phosphatase: 143 U/L — ABNORMAL HIGH (ref 38–126)
Anion gap: 12 (ref 5–15)
BUN: 6 mg/dL (ref 6–20)
CO2: 21 mmol/L — ABNORMAL LOW (ref 22–32)
Calcium: 8.3 mg/dL — ABNORMAL LOW (ref 8.9–10.3)
Chloride: 101 mmol/L (ref 101–111)
Creatinine, Ser: 0.93 mg/dL (ref 0.44–1.00)
GFR calc Af Amer: >60 mL/min (ref >60)
GFR calc non Af Amer: >60 mL/min (ref >60)
Glucose, Bld: 95 mg/dL (ref 65–99)
Potassium: 5.3 mmol/L — ABNORMAL HIGH (ref 3.5–5.1)
Sodium: 134 mmol/L — ABNORMAL LOW (ref 135–145)
Total Bilirubin: 2.2 mg/dL — ABNORMAL HIGH (ref 0.3–1.2)
Total Protein: 6.5 g/dL (ref 6.5–8.1)

## 2017-04-20 LAB — I-STAT BETA HCG BLOOD, ED (MC, WL, AP ONLY): I-stat hCG, quantitative: 5.1 m[IU]/mL — ABNORMAL HIGH (ref <5)

## 2017-04-20 LAB — RESPIRATORY PANEL BY PCR

## 2017-04-20 LAB — CBC WITH DIFFERENTIAL/PLATELET
Basophils Absolute: 0 10*3/uL (ref 0.0–0.1)
Basophils Relative: 0 %
Eosinophils Absolute: 0.1 10*3/uL (ref 0.0–0.7)
Eosinophils Relative: 1 %
HCT: 39.2 % (ref 36.0–46.0)
Hemoglobin: 13 g/dL (ref 12.0–15.0)
Lymphocytes Relative: 43 %
Lymphs Abs: 3.8 10*3/uL (ref 0.7–4.0)
MCH: 32.7 pg (ref 26.0–34.0)
MCHC: 33.2 g/dL (ref 30.0–36.0)
MCV: 98.5 fL (ref 78.0–100.0)
Monocytes Absolute: 0.9 10*3/uL (ref 0.1–1.0)
Monocytes Relative: 10 %
Neutro Abs: 4 10*3/uL (ref 1.7–7.7)
Neutrophils Relative %: 46 %
Platelets: 301 10*3/uL (ref 150–400)
RBC: 3.98 MIL/uL (ref 3.87–5.11)
RDW: 14.3 % (ref 11.5–15.5)
WBC: 8.8 10*3/uL (ref 4.0–10.5)

## 2017-04-20 LAB — I-STAT CG4 LACTIC ACID, ED
Lactic Acid, Venous: 2.66 mmol/L (ref 0.5–1.9)
Lactic Acid, Venous: 2.2 mmol/L (ref 0.5–1.9)

## 2017-04-20 LAB — I-STAT TROPONIN, ED: Troponin i, poc: 0 ng/mL (ref 0.00–0.08)

## 2017-04-20 LAB — INFLUENZA PANEL BY PCR (TYPE A & B)
Influenza A By PCR: NEGATIVE
Influenza B By PCR: NEGATIVE

## 2017-04-20 MED ORDER — DICLOFENAC SODIUM 1 % TD GEL
4.0000 g | Freq: Four times a day (QID) | TRANSDERMAL | Status: DC
  Administered 2017-04-20 – 2017-04-21 (×4): 4 g via TOPICAL
  Filled 2017-04-20: qty 100

## 2017-04-20 MED ORDER — IPRATROPIUM-ALBUTEROL 0.5-2.5 (3) MG/3ML IN SOLN
3.0000 mL | Freq: Once | RESPIRATORY_TRACT | Status: AC
  Administered 2017-04-20: 3 mL via RESPIRATORY_TRACT
  Filled 2017-04-20: qty 3

## 2017-04-20 MED ORDER — SODIUM CHLORIDE 0.9 % IV SOLN
INTRAVENOUS | Status: DC
  Administered 2017-04-20: 17:00:00 via INTRAVENOUS
  Administered 2017-04-21: 1000 mL via INTRAVENOUS
  Administered 2017-04-21: 02:00:00 via INTRAVENOUS

## 2017-04-20 MED ORDER — LEVALBUTEROL HCL 1.25 MG/0.5ML IN NEBU
1.2500 mg | INHALATION_SOLUTION | Freq: Once | RESPIRATORY_TRACT | Status: AC
Start: 2017-04-20 — End: 2017-04-20
  Administered 2017-04-20: 1.25 mg via RESPIRATORY_TRACT
  Filled 2017-04-20: qty 0.5

## 2017-04-20 MED ORDER — BUPRENORPHINE HCL 2 MG SL SUBL: 4.0000 mg | SUBLINGUAL_TABLET | Freq: Once | SUBLINGUAL | Status: DC

## 2017-04-20 MED ORDER — ONDANSETRON 4 MG PO TBDP
4.0000 mg | ORAL_TABLET | Freq: Three times a day (TID) | ORAL | Status: DC | PRN
  Administered 2017-04-20: 4 mg via ORAL
  Filled 2017-04-20: qty 1

## 2017-04-20 MED ORDER — HYDROXYZINE HCL 25 MG PO TABS
25.0000 mg | ORAL_TABLET | Freq: Once | ORAL | Status: AC
  Administered 2017-04-20: 25 mg via ORAL
  Filled 2017-04-20: qty 1

## 2017-04-20 MED ORDER — DEXTROSE 5 % IV SOLN
1.0000 g | INTRAVENOUS | Status: DC
  Filled 2017-04-20: qty 10

## 2017-04-20 MED ORDER — KETOROLAC TROMETHAMINE 15 MG/ML IJ SOLN
15.0000 mg | Freq: Four times a day (QID) | INTRAMUSCULAR | Status: DC | PRN
  Administered 2017-04-20 – 2017-04-23 (×8): 15 mg via INTRAVENOUS
  Filled 2017-04-20 (×9): qty 1

## 2017-04-20 MED ORDER — IPRATROPIUM-ALBUTEROL 0.5-2.5 (3) MG/3ML IN SOLN
3.0000 mL | Freq: Three times a day (TID) | RESPIRATORY_TRACT | Status: DC
  Administered 2017-04-20 – 2017-04-23 (×8): 3 mL via RESPIRATORY_TRACT
  Filled 2017-04-20 (×8): qty 3

## 2017-04-20 MED ORDER — DEXTROSE 5 % IV SOLN: 500.0000 mg | Freq: Once | INTRAVENOUS | Status: DC

## 2017-04-20 MED ORDER — ONDANSETRON HCL 4 MG/2ML IJ SOLN
4.0000 mg | Freq: Once | INTRAMUSCULAR | Status: AC
  Administered 2017-04-20: 4 mg via INTRAVENOUS
  Filled 2017-04-20: qty 2

## 2017-04-20 MED ORDER — LEVALBUTEROL HCL 1.25 MG/0.5ML IN NEBU: 1.2500 mg | INHALATION_SOLUTION | Freq: Four times a day (QID) | RESPIRATORY_TRACT | Status: DC | PRN

## 2017-04-20 MED ORDER — MORPHINE SULFATE (PF) 4 MG/ML IV SOLN: 6.0000 mg | Freq: Once | INTRAVENOUS | Status: DC

## 2017-04-20 MED ORDER — PANTOPRAZOLE SODIUM 40 MG PO TBEC
40.0000 mg | DELAYED_RELEASE_TABLET | Freq: Every day | ORAL | Status: DC
Start: 2017-04-20 — End: 2017-04-23
  Administered 2017-04-21 – 2017-04-23 (×3): 40 mg via ORAL
  Filled 2017-04-20 (×3): qty 1

## 2017-04-20 MED ORDER — RAMELTEON 8 MG PO TABS
8.0000 mg | ORAL_TABLET | Freq: Every evening | ORAL | Status: DC | PRN
  Administered 2017-04-20: 8 mg via ORAL
  Filled 2017-04-20 (×2): qty 1

## 2017-04-20 MED ORDER — OSELTAMIVIR PHOSPHATE 75 MG PO CAPS
75.0000 mg | ORAL_CAPSULE | Freq: Two times a day (BID) | ORAL | Status: DC
  Administered 2017-04-20 – 2017-04-21 (×2): 75 mg via ORAL
  Filled 2017-04-20 (×2): qty 1

## 2017-04-20 MED ORDER — ENOXAPARIN SODIUM 40 MG/0.4ML ~~LOC~~ SOLN
40.0000 mg | SUBCUTANEOUS | Status: DC
  Administered 2017-04-20 – 2017-04-22 (×3): 40 mg via SUBCUTANEOUS
  Filled 2017-04-20 (×3): qty 0.4

## 2017-04-20 MED ORDER — SODIUM CHLORIDE 0.9 % IV BOLUS (SEPSIS)
1000.0000 mL | Freq: Once | INTRAVENOUS | Status: AC
  Administered 2017-04-20: 1000 mL via INTRAVENOUS

## 2017-04-20 MED ORDER — ACETAMINOPHEN 500 MG PO TABS
500.0000 mg | ORAL_TABLET | Freq: Four times a day (QID) | ORAL | Status: DC | PRN
  Administered 2017-04-23: 500 mg via ORAL
  Filled 2017-04-20: qty 1

## 2017-04-20 MED ORDER — DEXTROSE 5 % IV SOLN
1.0000 g | Freq: Once | INTRAVENOUS | Status: AC
  Administered 2017-04-20: 1 g via INTRAVENOUS

## 2017-04-20 MED ORDER — DEXTROSE 5 % IV SOLN: 500.0000 mg | INTRAVENOUS | Status: DC

## 2017-04-20 NOTE — Progress Notes (Signed)
Pharmacy Antibiotic Note  Jamie [name] is a 51 y.o. female admitted on 04/20/2017 with cough x 3 weeks. All labs pending, LA 2.6. To start ceftriaxone/azithromycin. Pharmacy was present for this code sepsis.  Plan: -Ceftriaxone 1 g IV q24h -Azithromycin 500 mg IV q24h -Monitor renal fx, cultures, duration of therapy   Height: 5\' 4"  (162.6 cm) Weight: 136 lb (61.7 kg) IBW/kg (Calculated) : 54.7  Temp (24hrs), Avg:98 F (36.7 C), Min:98 F (36.7 C), Max:98 F (36.7 C)  Recent Labs  Lab 04/20/17 1415  LATICACIDVEN 2.66*     Antimicrobials this admission: 12/26 azithromycin > 12/26 ceftriaxone >  Dose adjustments this admission: N/A  Microbiology results: 12/26 blood cx:  Harvel Quale 04/20/2017 2:37 PM

## 2017-04-20 NOTE — ED Triage Notes (Signed)
Pt reports non-productive cough x 3 weeks. Started to have SOB 3 days ago and cp today.  Pt endorses tightness in her chest with difficulty breathing.
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
## 2017-04-20 NOTE — ED Notes (Signed)
Patient transported to X-ray 

## 2017-04-20 NOTE — Progress Notes (Signed)
Patient very anxious is asking for medication. MD made aware. Will continue to monitor.

## 2017-04-20 NOTE — Progress Notes (Signed)
Patient trasfered from ED to [phone] via stretcher; alert and oriented x 4; complaints of chest pain due to cough and is very anxious; IV saline locked in [place] and fluids running @125cc /hr in RAC; Orient patient to room and unit; gave patient care guide; instructed how to use the call bell and  fall risk precautions. Will continue to monitor the patient.
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
## 2017-04-21 ENCOUNTER — Observation Stay (HOSPITAL_COMMUNITY): Payer: Medicare Other

## 2017-04-21 DIAGNOSIS — F1199 Opioid use, unspecified with unspecified opioid-induced disorder: Secondary | ICD-10-CM | POA: Insufficient documentation

## 2017-04-21 DIAGNOSIS — K219 Gastro-esophageal reflux disease without esophagitis: Secondary | ICD-10-CM | POA: Diagnosis present

## 2017-04-21 DIAGNOSIS — J209 Acute bronchitis, unspecified: Principal | ICD-10-CM

## 2017-04-21 DIAGNOSIS — I10 Essential (primary) hypertension: Secondary | ICD-10-CM | POA: Diagnosis present

## 2017-04-21 DIAGNOSIS — F1190 Opioid use, unspecified, uncomplicated: Secondary | ICD-10-CM | POA: Diagnosis not present

## 2017-04-21 DIAGNOSIS — J189 Pneumonia, unspecified organism: Secondary | ICD-10-CM | POA: Diagnosis not present

## 2017-04-21 DIAGNOSIS — F319 Bipolar disorder, unspecified: Secondary | ICD-10-CM | POA: Diagnosis present

## 2017-04-21 DIAGNOSIS — F1110 Opioid abuse, uncomplicated: Secondary | ICD-10-CM | POA: Diagnosis present

## 2017-04-21 DIAGNOSIS — F419 Anxiety disorder, unspecified: Secondary | ICD-10-CM | POA: Diagnosis present

## 2017-04-21 DIAGNOSIS — K7030 Alcoholic cirrhosis of liver without ascites: Secondary | ICD-10-CM | POA: Diagnosis present

## 2017-04-21 DIAGNOSIS — R000 Tachycardia, unspecified: Secondary | ICD-10-CM | POA: Diagnosis present

## 2017-04-21 DIAGNOSIS — E876 Hypokalemia: Secondary | ICD-10-CM | POA: Diagnosis present

## 2017-04-21 DIAGNOSIS — K7460 Unspecified cirrhosis of liver: Secondary | ICD-10-CM | POA: Insufficient documentation

## 2017-04-21 DIAGNOSIS — Z79899 Other long term (current) drug therapy: Secondary | ICD-10-CM | POA: Diagnosis not present

## 2017-04-21 DIAGNOSIS — F1990 Other psychoactive substance use, unspecified, uncomplicated: Secondary | ICD-10-CM | POA: Diagnosis not present

## 2017-04-21 DIAGNOSIS — Z91410 Personal history of adult physical and sexual abuse: Secondary | ICD-10-CM

## 2017-04-21 DIAGNOSIS — Z86711 Personal history of pulmonary embolism: Secondary | ICD-10-CM | POA: Diagnosis not present

## 2017-04-21 LAB — COMPREHENSIVE METABOLIC PANEL
ALT: 26 U/L (ref 14–54)
AST: 49 U/L — ABNORMAL HIGH (ref 15–41)
Albumin: 2.6 g/dL — ABNORMAL LOW (ref 3.5–5.0)
Alkaline Phosphatase: 124 U/L (ref 38–126)
Anion gap: 12 (ref 5–15)
BUN: 5 mg/dL — ABNORMAL LOW (ref 6–20)
CO2: 17 mmol/L — ABNORMAL LOW (ref 22–32)
Calcium: 7.7 mg/dL — ABNORMAL LOW (ref 8.9–10.3)
Chloride: 109 mmol/L (ref 101–111)
Creatinine, Ser: 0.93 mg/dL (ref 0.44–1.00)
GFR calc Af Amer: >60 mL/min (ref >60)
GFR calc non Af Amer: >60 mL/min (ref >60)
Glucose, Bld: 72 mg/dL (ref 65–99)
Potassium: 3.1 mmol/L — ABNORMAL LOW (ref 3.5–5.1)
Sodium: 138 mmol/L (ref 135–145)
Total Bilirubin: 1.1 mg/dL (ref 0.3–1.2)
Total Protein: 6.4 g/dL — ABNORMAL LOW (ref 6.5–8.1)

## 2017-04-21 LAB — LACTIC ACID, PLASMA
Lactic Acid, Venous: 3.3 mmol/L (ref 0.5–1.9)
Lactic Acid, Venous: 3.5 mmol/L (ref 0.5–1.9)

## 2017-04-21 LAB — CBC
HCT: 38.3 % (ref 36.0–46.0)
Hemoglobin: 12.3 g/dL (ref 12.0–15.0)
MCH: 32.5 pg (ref 26.0–34.0)
MCHC: 32.1 g/dL (ref 30.0–36.0)
MCV: 101.3 fL — ABNORMAL HIGH (ref 78.0–100.0)
Platelets: 211 10*3/uL (ref 150–400)
RBC: 3.78 MIL/uL — ABNORMAL LOW (ref 3.87–5.11)
RDW: 14.6 % (ref 11.5–15.5)
WBC: 5.5 10*3/uL (ref 4.0–10.5)

## 2017-04-21 LAB — HIV ANTIBODY (ROUTINE TESTING W REFLEX): HIV Screen 4th Generation wRfx: NONREACTIVE

## 2017-04-21 MED ORDER — LACTATED RINGERS IV SOLN
INTRAVENOUS | Status: DC
  Administered 2017-04-21: 1000 mL via INTRAVENOUS

## 2017-04-21 MED ORDER — HYDROXYZINE HCL 25 MG PO TABS
25.0000 mg | ORAL_TABLET | Freq: Once | ORAL | Status: AC
  Administered 2017-04-21: 25 mg via ORAL
  Filled 2017-04-21: qty 1

## 2017-04-21 MED ORDER — RAMELTEON 8 MG PO TABS
8.0000 mg | ORAL_TABLET | Freq: Every day | ORAL | Status: DC
  Administered 2017-04-21 – 2017-04-22 (×2): 8 mg via ORAL
  Filled 2017-04-21 (×2): qty 1

## 2017-04-21 MED ORDER — LACTATED RINGERS IV BOLUS (SEPSIS)
1000.0000 mL | Freq: Once | INTRAVENOUS | Status: AC
  Administered 2017-04-21: 1000 mL via INTRAVENOUS

## 2017-04-21 MED ORDER — POTASSIUM CHLORIDE CRYS ER 20 MEQ PO TBCR
60.0000 meq | EXTENDED_RELEASE_TABLET | Freq: Once | ORAL | Status: AC
  Administered 2017-04-21: 60 meq via ORAL
  Filled 2017-04-21: qty 3

## 2017-04-21 MED ORDER — BLISTEX MEDICATED EX OINT
TOPICAL_OINTMENT | CUTANEOUS | Status: DC | PRN
  Administered 2017-04-21: 1 via TOPICAL
  Administered 2017-04-22: 16:00:00 via TOPICAL
  Filled 2017-04-21 (×2): qty 6.3

## 2017-04-21 MED ORDER — LOPERAMIDE HCL 2 MG PO CAPS
4.0000 mg | ORAL_CAPSULE | Freq: Once | ORAL | Status: DC
  Filled 2017-04-21: qty 2

## 2017-04-21 MED ORDER — VALACYCLOVIR HCL 500 MG PO TABS
1000.0000 mg | ORAL_TABLET | Freq: Two times a day (BID) | ORAL | Status: DC
  Administered 2017-04-21 – 2017-04-23 (×4): 1000 mg via ORAL
  Filled 2017-04-21 (×4): qty 2

## 2017-04-21 MED ORDER — LIP MEDEX EX OINT: TOPICAL_OINTMENT | CUTANEOUS | Status: DC | PRN
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
## 2017-04-21 NOTE — Progress Notes (Signed)
CRITICAL VALUE ALERT  Critical Value:  Lactic Acid 3.5  Date & Time Notied:  04/21/2017; 14:45  Provider Notified: Dr. Heber Causey  Orders Received/Actions taken: see MAR

## 2017-04-21 NOTE — Progress Notes (Signed)
CRITICAL VALUE ALERT  Critical Value:  Lactic Acid 3.3  Date & Time Notied:  04/21/2017; 07:50  Provider Notified: Dr. Heber [place]  Orders Received/Actions taken: see MAR

## 2017-04-21 NOTE — Progress Notes (Addendum)
   Subjective: Patient seen and examined. She states her breathing is improved and she feels better this morning. She has been having episodes of coughing, chest tightness, and difficulty breath that cause her significant anxiety, which she was requesting a medicine form. Discussed with the patient that the best medicines for anxiety also have habit forming qualities and we do not want to put her at risk that. She agreed and we discussed that treating the underlying illness will most likely help with her anxiety.   Objective:  Vital signs in last 24 hours: Vitals:   04/20/17 2021 04/20/17 2137 04/21/17 0518 04/21/17 0843  BP:  133/62 128/77   Pulse:   94   Resp:  18 18   Temp:  98.8 F (37.1 C) 98.4 F (36.9 C)   TempSrc:  Oral    SpO2: 97% 100% 100% 100%  Weight:      Height:       General: Laying in bed comfortably, restless, NAD HEENT: Shell/AT, no scleral icterus Cardiac: Tachycardic regular rhythm, No R/M/G appreciated Pulm: normal effort, CTAB Abd: soft, non tender, non distended, BS normal Ext: extremities well perfused, no peripheral edema Neuro: alert and oriented X3, cranial nerves II-XII grossly intact   Assessment/Plan:  Principal Problem:   Acute bronchitis  Bronchitis  Tachycardic, afebrile (on toradol) without leukocytosis, WBC 5.5 today; elevated lactic acid 2.66>>3.3 despite fluid resuscitation, saturating high 90s on RA. Lung exam today improved but still with diffuse expiratory wheeze throughout all lung fields. Repeat CXR w/o acute infiltrates or opacities that would suggest a lobar pneumonia. Patient's history and negative CXR consistent with viral bronchitis. Respiratory viral panel negative.   -Discontinue ceftriaxone -repeat lactic acid pending -Levalbuterol every 6 hours PRN for SOB and wheezing -tylenol, toradol and voltaren gel for chest wall/rib pain -PRN zofran for nausea and vomiting  -D/cTamiflu as influenza panel was negative  Hypokalemia Serum  potassium 3.1 today. -60 meq Kdur -Repeat BMET in AM  Substance use disorder  Patient now 19 months clean. She was previously on suboxone maintenance therapy, but stopped several weeks ago because of her acute illness. PCP planning on weaning patient off due to liver dysfunction.  -NO narcotics for pain per patient request - see pain regimen above -NO benzos for anxiety- discussed this with patient she is amenable -will not resume suboxone at this time   ETOH Cirrhosis Compensated. CMET improving, AST 49, T bili 1.1 -Repeat CMET in AM   Hypertension Currently normotensive 128/77. Patient previously on Carvedilol 12.5 mg daily, but has not been taking it.  -will continue to monitor  GERD -Protonix 40 daily     Dispo: Anticipated discharge in approximately 1-2 days.   Melanee Spry, MD 04/21/2017, 12:40 PM Pager: [phone]

## 2017-04-22 LAB — BASIC METABOLIC PANEL
Anion gap: 10 (ref 5–15)
BUN: <5 mg/dL — ABNORMAL LOW (ref 6–20)
CO2: 21 mmol/L — ABNORMAL LOW (ref 22–32)
Calcium: 8.1 mg/dL — ABNORMAL LOW (ref 8.9–10.3)
Chloride: 108 mmol/L (ref 101–111)
Creatinine, Ser: 0.83 mg/dL (ref 0.44–1.00)
GFR calc Af Amer: >60 mL/min (ref >60)
GFR calc non Af Amer: >60 mL/min (ref >60)
Glucose, Bld: 72 mg/dL (ref 65–99)
Potassium: 4.1 mmol/L (ref 3.5–5.1)
Sodium: 139 mmol/L (ref 135–145)

## 2017-04-22 LAB — CBC
HCT: 33.2 % — ABNORMAL LOW (ref 36.0–46.0)
Hemoglobin: 10.6 g/dL — ABNORMAL LOW (ref 12.0–15.0)
MCH: 32.6 pg (ref 26.0–34.0)
MCHC: 31.9 g/dL (ref 30.0–36.0)
MCV: 102.2 fL — ABNORMAL HIGH (ref 78.0–100.0)
Platelets: 158 10*3/uL (ref 150–400)
RBC: 3.25 MIL/uL — ABNORMAL LOW (ref 3.87–5.11)
RDW: 14.8 % (ref 11.5–15.5)
WBC: 4.3 10*3/uL (ref 4.0–10.5)

## 2017-04-22 MED ORDER — WITCH HAZEL-GLYCERIN EX PADS
MEDICATED_PAD | CUTANEOUS | Status: DC | PRN
  Administered 2017-04-22: 16:00:00 via TOPICAL
  Filled 2017-04-22: qty 100

## 2017-04-22 NOTE — Progress Notes (Addendum)
   Subjective: Patient seen walking around her room this morning in no acute distress. Patient able to breath easily on room air and notes continued improvement in her symptoms since admission. Patient responds well to breathing treatments, but does not currently have inhaler's available at home.   Objective:  Vital signs in last 24 hours: Vitals:   04/21/17 2039 04/21/17 2157 04/22/17 0617 04/22/17 0800  BP:  109/64 117/70   Pulse:  (!) 114 94   Resp:  18 18   Temp:  98.3 F (36.8 C) 97.8 F (36.6 C)   TempSrc:      SpO2: 98% 100% 98% 100%  Weight:      Height:       Physical Exam  Constitutional: She appears well-developed and well-nourished. No distress.  Cardiovascular: Normal rate and regular rhythm. Exam reveals no friction rub.  No murmur heard. Respiratory: Effort normal.  No accessory muscle use or nasal flaring. No signs of cyanosis. Wheezing heard in all lung fields with intermittent dry cough. No crackles appreciated.  GI: Soft. Bowel sounds are normal. She exhibits no distension. There is no tenderness.  Musculoskeletal: She exhibits no edema (of bilateral lower extremities) or tenderness (of bilateral lower extremities).  Skin: Skin is warm and dry. No rash noted. No erythema.  Psychiatric: She has a normal mood and affect. Her behavior is normal. Thought content normal.   Assessment/Plan:  Principal Problem:   Acute bronchitis  Bronchitis: Patient continues to endorse improvement in her initial symptoms of shortness of breath that brought her to the hospital. She endorses intermittent SOB which occurs mainly when she coughs. She describes a tightness in her chest when this happens, which has been relieved with use of breathing treatments while inpatient. Since patient does not have good outpatient follow up and she is still wheezing on exam, will continue supportive care today and plan for discharge tomorrow. Will arrange for nebulizer machine and treatments as  outpatient. Patient will also need outpatient PFT's after acute illness to evaluate for obstructive process -Continue supportive care for cough -Continue levalbuterol while inpatient -Outpatient nebulized albuterol and albuterol inhalers; will place DME today.  Substance use disorder: Patient now 19 months clean. She was previously on suboxone maintenance therapy, but self discontinued. Will avoid narcotics for pain per patient request and continue to avoid benzos for anxiety. -Recommend outpatient follow up with PCP  Hypertension: Patient currently normotensive in 110s/70s -Continue to monitor  GERD -Protonix 40 daily  VTE prophylaxis -Lovenox  Dispo: Anticipated discharge today.  Thomasene Ripple, MD 04/22/2017, 11:52 AM Pager: [phone]

## 2017-04-23 DIAGNOSIS — F1990 Other psychoactive substance use, unspecified, uncomplicated: Secondary | ICD-10-CM

## 2017-04-23 MED ORDER — ALBUTEROL SULFATE HFA 108 (90 BASE) MCG/ACT IN AERS: 2.0000 | INHALATION_SPRAY | Freq: Four times a day (QID) | RESPIRATORY_TRACT | 2 refills | Status: DC | PRN

## 2017-04-23 MED ORDER — RAMELTEON 8 MG PO TABS: 8.0000 mg | ORAL_TABLET | Freq: Every evening | ORAL | 0 refills | Status: DC | PRN

## 2017-04-23 MED ORDER — ALBUTEROL SULFATE (2.5 MG/3ML) 0.083% IN NEBU: 2.5000 mg | INHALATION_SOLUTION | Freq: Four times a day (QID) | RESPIRATORY_TRACT | 12 refills | Status: DC | PRN

## 2017-04-23 MED ORDER — VALACYCLOVIR HCL 1 G PO TABS: ORAL_TABLET | ORAL | 0 refills | Status: DC

## 2017-04-23 NOTE — Progress Notes (Signed)
   Subjective: Patient seen laying comfortably in bed this AM in no acute distress. States she feels well today and breathing significantly improved from yesterday. No acute concerns this AM. Plans to stay with her daughter for a few days after discharge to ensure she is back to baseline.   Objective:  Vital signs in last 24 hours: Vitals:   04/22/17 2019 04/22/17 2140 04/23/17 0607 04/23/17 0904  BP:  (!) 124/55 123/69   Pulse:  (!) 107 96   Resp:  16 20   Temp:  98 F (36.7 C) 98.4 F (36.9 C)   TempSrc:  Oral Oral   SpO2: 99% 100% 100% 98%  Weight:      Height:       Physical Exam  Constitutional: She appears well-developed and well-nourished. No distress.  HENT:  Mouth/Throat: Oropharynx is clear and moist. No oropharyngeal exudate.  Cardiovascular: Normal rate, regular rhythm and intact distal pulses. Exam reveals no friction rub.  No murmur heard. Respiratory: Effort normal.  No accessory muscle use or nasal flaring. No signs of cyanosis. Minimal wheezing heard in small lung fields, interval improvement from yesterday's exam.   GI: Soft. Bowel sounds are normal. She exhibits no distension. There is no tenderness.  Musculoskeletal: She exhibits no edema (of bilateral lower extremities) or tenderness (of bilateral lower extremities).  Skin: Skin is warm and dry. No rash noted. No erythema.   Assessment/Plan:  Principal Problem:   Acute bronchitis  Bronchitis: Patient's symptoms and exam much improved from yesterday. She is stable for discharge home today. Recommend follow up with primary care physician regarding outpatient PFT's. Will prescribe albuterol nebulizer medicines and inhalers upon discharge.  -Continue supportive care for cough with albuterol nebulizer at home -Nebulizer machine  Substance use disorder:  -Recommend outpatient follow up with PCP  Hypertension: Patient hs remained normotensive throughout admission -Continue to monitor  GERD -Protonix 40  daily  VTE prophylaxis -Lovenox  Dispo: Anticipated discharge today.  Thomasene Ripple, MD 04/23/2017, 12:43 PM Pager: [phone]

## 2017-04-23 NOTE — Progress Notes (Signed)
Medicine attending discharge note: I personally interviewed and examined this patient today and reviewed discharge plans with resident physician Dr. Thomasene Ripple.  I concur with her evaluation and management plan which we discussed together.  51 year old woman admitted on December 26 with acute bronchitis with a bronchospastic component.  Symptoms progressed despite outpatient treatment with azithromycin and steroids.  Chest radiograph showed no acute infiltrate or effusion including a study done after IV hydration. She was treated symptomatically with bronchodilators.  She received an initial dose of ceftriaxone in the emergency department when lactic acid returned borderline elevated pending further evaluation.  A respiratory virus panel was negative.  Rapid screen for influenza A and B-. Condition improved rapidly.  Her daughter who is a PhD nurse just arrived in town and will be staying with her mom for the next 5 days.  Disposition: Condition stable at time of discharge She will follow-up with her primary care physician There were no complications

## 2017-04-23 NOTE — Progress Notes (Signed)
Patient discharge teaching given, including activity, diet, follow-up appoints, and medications. Patient verbalized understanding of all discharge instructions. IV access was d/c'd. Vitals are stable. Skin is intact except as charted in most recent assessments. Pt to be escorted out by Nurse, to be driven home by family. 

## 2017-04-23 NOTE — Care Management Note (Signed)
Case Management Note  Patient Details  Name: Jamie [name] MRN: [id number] Date of Birth: 1965/04/29  Subjective/Objective:                 Nebulizer to be delivered to room by [facility] prior to DC.    Action/Plan:   Expected Discharge Date:                  Expected Discharge Plan:  Home/Self Care  In-House Referral:     Discharge planning Services  CM Consult  Post Acute Care Choice:  Durable Medical Equipment Choice offered to:     DME Arranged:  Nebulizer machine DME Agency:  [place]:    [facility] Agency:     Status of Service:  Completed, signed off  If discussed at [place] of Stay Meetings, dates discussed:    Additional Comments:  Carles Collet, RN 04/23/2017, 9:58 AM

## 2017-04-25 LAB — CULTURE, BLOOD (ROUTINE X 2)
Culture: NO GROWTH
Culture: NO GROWTH
Special Requests: ADEQUATE

## 2017-04-29 ENCOUNTER — Ambulatory Visit
Admission: RE | Admit: 2017-04-29 | Discharge: 2017-04-29 | Disposition: A | Discharge status: Home / Self Care | Payer: Medicare Other | Source: Ambulatory Visit | Attending: Physician Assistant | Admitting: Physician Assistant

## 2017-04-29 DIAGNOSIS — K7460 Unspecified cirrhosis of liver: Secondary | ICD-10-CM

## 2017-04-29 DIAGNOSIS — R112 Nausea with vomiting, unspecified: Secondary | ICD-10-CM | POA: Diagnosis not present

## 2017-04-29 DIAGNOSIS — K625 Hemorrhage of anus and rectum: Secondary | ICD-10-CM | POA: Diagnosis not present

## 2017-04-29 DIAGNOSIS — R1013 Epigastric pain: Secondary | ICD-10-CM

## 2017-05-21 DIAGNOSIS — I700 Atherosclerosis of aorta: Secondary | ICD-10-CM | POA: Diagnosis not present

## 2017-05-21 DIAGNOSIS — K7030 Alcoholic cirrhosis of liver without ascites: Secondary | ICD-10-CM | POA: Diagnosis not present

## 2017-05-21 DIAGNOSIS — K449 Diaphragmatic hernia without obstruction or gangrene: Secondary | ICD-10-CM | POA: Diagnosis not present

## 2017-05-21 DIAGNOSIS — K209 Esophagitis, unspecified: Secondary | ICD-10-CM | POA: Diagnosis not present

## 2017-05-21 DIAGNOSIS — R0602 Shortness of breath: Secondary | ICD-10-CM | POA: Diagnosis not present

## 2017-05-21 DIAGNOSIS — K2971 Gastritis, unspecified, with bleeding: Secondary | ICD-10-CM | POA: Diagnosis not present

## 2017-05-21 DIAGNOSIS — R079 Chest pain, unspecified: Secondary | ICD-10-CM | POA: Diagnosis not present

## 2017-05-21 DIAGNOSIS — K760 Fatty (change of) liver, not elsewhere classified: Secondary | ICD-10-CM | POA: Diagnosis not present

## 2017-05-21 DIAGNOSIS — K222 Esophageal obstruction: Secondary | ICD-10-CM | POA: Diagnosis not present

## 2017-05-21 DIAGNOSIS — K920 Hematemesis: Secondary | ICD-10-CM | POA: Diagnosis not present

## 2017-05-22 DIAGNOSIS — K449 Diaphragmatic hernia without obstruction or gangrene: Secondary | ICD-10-CM | POA: Diagnosis not present

## 2017-05-22 DIAGNOSIS — D500 Iron deficiency anemia secondary to blood loss (chronic): Secondary | ICD-10-CM | POA: Diagnosis not present

## 2017-05-22 DIAGNOSIS — K209 Esophagitis, unspecified: Secondary | ICD-10-CM | POA: Diagnosis not present

## 2017-05-22 DIAGNOSIS — Z79899 Other long term (current) drug therapy: Secondary | ICD-10-CM | POA: Diagnosis not present

## 2017-05-22 DIAGNOSIS — K219 Gastro-esophageal reflux disease without esophagitis: Secondary | ICD-10-CM | POA: Diagnosis not present

## 2017-05-22 DIAGNOSIS — Z87898 Personal history of other specified conditions: Secondary | ICD-10-CM | POA: Diagnosis not present

## 2017-05-22 DIAGNOSIS — K760 Fatty (change of) liver, not elsewhere classified: Secondary | ICD-10-CM | POA: Diagnosis not present

## 2017-05-22 DIAGNOSIS — R079 Chest pain, unspecified: Secondary | ICD-10-CM | POA: Diagnosis not present

## 2017-05-22 DIAGNOSIS — K920 Hematemesis: Secondary | ICD-10-CM | POA: Diagnosis not present

## 2017-05-22 DIAGNOSIS — D649 Anemia, unspecified: Secondary | ICD-10-CM | POA: Diagnosis not present

## 2017-05-22 DIAGNOSIS — Z2821 Immunization not carried out because of patient refusal: Secondary | ICD-10-CM | POA: Diagnosis not present

## 2017-05-22 DIAGNOSIS — K2971 Gastritis, unspecified, with bleeding: Secondary | ICD-10-CM | POA: Diagnosis not present

## 2017-05-22 DIAGNOSIS — D696 Thrombocytopenia, unspecified: Secondary | ICD-10-CM | POA: Diagnosis present

## 2017-05-22 DIAGNOSIS — K222 Esophageal obstruction: Secondary | ICD-10-CM | POA: Diagnosis not present

## 2017-05-22 DIAGNOSIS — I700 Atherosclerosis of aorta: Secondary | ICD-10-CM | POA: Diagnosis not present

## 2017-05-22 DIAGNOSIS — R0602 Shortness of breath: Secondary | ICD-10-CM | POA: Diagnosis not present

## 2017-05-22 DIAGNOSIS — K7581 Nonalcoholic steatohepatitis (NASH): Secondary | ICD-10-CM | POA: Diagnosis not present

## 2017-05-22 DIAGNOSIS — R000 Tachycardia, unspecified: Secondary | ICD-10-CM | POA: Diagnosis present

## 2017-05-22 DIAGNOSIS — K7030 Alcoholic cirrhosis of liver without ascites: Secondary | ICD-10-CM | POA: Diagnosis not present

## 2017-05-27 ENCOUNTER — Emergency Department (HOSPITAL_COMMUNITY): Payer: 59

## 2017-05-27 ENCOUNTER — Other Ambulatory Visit: Payer: Self-pay

## 2017-05-27 ENCOUNTER — Emergency Department (HOSPITAL_COMMUNITY)
Admission: EM | Admit: 2017-05-27 | Discharge: 2017-05-27 | Disposition: A | Discharge status: Home / Self Care | Payer: 59 | Attending: Emergency Medicine | Admitting: Emergency Medicine

## 2017-05-27 ENCOUNTER — Encounter (HOSPITAL_COMMUNITY): Payer: Self-pay | Admitting: *Deleted

## 2017-05-27 DIAGNOSIS — N390 Urinary tract infection, site not specified: Secondary | ICD-10-CM

## 2017-05-27 DIAGNOSIS — R5383 Other fatigue: Secondary | ICD-10-CM | POA: Insufficient documentation

## 2017-05-27 DIAGNOSIS — R0789 Other chest pain: Secondary | ICD-10-CM | POA: Diagnosis not present

## 2017-05-27 DIAGNOSIS — R51 Headache: Secondary | ICD-10-CM | POA: Diagnosis not present

## 2017-05-27 DIAGNOSIS — R112 Nausea with vomiting, unspecified: Secondary | ICD-10-CM | POA: Insufficient documentation

## 2017-05-27 DIAGNOSIS — R079 Chest pain, unspecified: Secondary | ICD-10-CM | POA: Diagnosis not present

## 2017-05-27 DIAGNOSIS — Z79899 Other long term (current) drug therapy: Secondary | ICD-10-CM | POA: Insufficient documentation

## 2017-05-27 DIAGNOSIS — R109 Unspecified abdominal pain: Secondary | ICD-10-CM | POA: Diagnosis not present

## 2017-05-27 DIAGNOSIS — R1084 Generalized abdominal pain: Secondary | ICD-10-CM | POA: Diagnosis not present

## 2017-05-27 LAB — URINALYSIS, ROUTINE W REFLEX MICROSCOPIC
Bilirubin Urine: NEGATIVE
Glucose, UA: NEGATIVE mg/dL
Ketones, ur: NEGATIVE mg/dL
Nitrite: POSITIVE — AB
Protein, ur: NEGATIVE mg/dL
Specific Gravity, Urine: 1.01 (ref 1.005–1.030)
pH: 8 (ref 5.0–8.0)

## 2017-05-27 LAB — CBC
HCT: 38.9 % (ref 36.0–46.0)
Hemoglobin: 13 g/dL (ref 12.0–15.0)
MCH: 33.8 pg (ref 26.0–34.0)
MCHC: 33.4 g/dL (ref 30.0–36.0)
MCV: 101 fL — ABNORMAL HIGH (ref 78.0–100.0)
Platelets: 237 10*3/uL (ref 150–400)
RBC: 3.85 MIL/uL — ABNORMAL LOW (ref 3.87–5.11)
RDW: 15.6 % — ABNORMAL HIGH (ref 11.5–15.5)
WBC: 5.2 10*3/uL (ref 4.0–10.5)

## 2017-05-27 LAB — COMPREHENSIVE METABOLIC PANEL
ALT: 24 U/L (ref 14–54)
AST: 61 U/L — ABNORMAL HIGH (ref 15–41)
Albumin: 2.7 g/dL — ABNORMAL LOW (ref 3.5–5.0)
Alkaline Phosphatase: 166 U/L — ABNORMAL HIGH (ref 38–126)
Anion gap: 12 (ref 5–15)
BUN: <5 mg/dL — ABNORMAL LOW (ref 6–20)
CO2: 24 mmol/L (ref 22–32)
Calcium: 8.7 mg/dL — ABNORMAL LOW (ref 8.9–10.3)
Chloride: 104 mmol/L (ref 101–111)
Creatinine, Ser: 0.9 mg/dL (ref 0.44–1.00)
GFR calc Af Amer: >60 mL/min (ref >60)
GFR calc non Af Amer: >60 mL/min (ref >60)
Glucose, Bld: 101 mg/dL — ABNORMAL HIGH (ref 65–99)
Potassium: 3.6 mmol/L (ref 3.5–5.1)
Sodium: 140 mmol/L (ref 135–145)
Total Bilirubin: 1.5 mg/dL — ABNORMAL HIGH (ref 0.3–1.2)
Total Protein: 6.3 g/dL — ABNORMAL LOW (ref 6.5–8.1)

## 2017-05-27 LAB — I-STAT BETA HCG BLOOD, ED (MC, WL, AP ONLY): I-stat hCG, quantitative: 7.9 m[IU]/mL — ABNORMAL HIGH (ref <5)

## 2017-05-27 LAB — I-STAT TROPONIN, ED: Troponin i, poc: 0 ng/mL (ref 0.00–0.08)

## 2017-05-27 LAB — LIPASE, BLOOD: Lipase: 22 U/L (ref 11–51)

## 2017-05-27 MED ORDER — DEXTROSE 5 % IV SOLN
1.0000 g | Freq: Once | INTRAVENOUS | Status: AC
  Administered 2017-05-27: 1 g via INTRAVENOUS
  Filled 2017-05-27: qty 10

## 2017-05-27 MED ORDER — HYDROMORPHONE HCL 1 MG/ML IJ SOLN
1.0000 mg | Freq: Once | INTRAMUSCULAR | Status: AC
  Administered 2017-05-27: 1 mg via INTRAVENOUS
  Filled 2017-05-27: qty 1

## 2017-05-27 MED ORDER — SODIUM CHLORIDE 0.9 % IV BOLUS (SEPSIS)
1000.0000 mL | Freq: Once | INTRAVENOUS | Status: AC
  Administered 2017-05-27: 1000 mL via INTRAVENOUS

## 2017-05-27 MED ORDER — CEPHALEXIN 500 MG PO CAPS: 500.0000 mg | ORAL_CAPSULE | Freq: Three times a day (TID) | ORAL | 0 refills | Status: DC

## 2017-05-27 MED ORDER — ONDANSETRON 4 MG PO TBDP
4.0000 mg | ORAL_TABLET | Freq: Once | ORAL | Status: AC | PRN
  Administered 2017-05-27: 4 mg via ORAL
  Filled 2017-05-27: qty 1

## 2017-05-27 MED ORDER — ONDANSETRON HCL 4 MG/2ML IJ SOLN
4.0000 mg | Freq: Once | INTRAMUSCULAR | Status: AC
  Administered 2017-05-27: 4 mg via INTRAVENOUS
  Filled 2017-05-27: qty 2

## 2017-05-27 MED ORDER — IOPAMIDOL (ISOVUE-300) INJECTION 61%
INTRAVENOUS | Status: AC
  Administered 2017-05-27: 100 mL
  Filled 2017-05-27: qty 100

## 2017-05-27 NOTE — ED Triage Notes (Signed)
Pt reports having endoscopy done two days ago and now has severe upper abd pain and chest pain. Reports having vomiting and diarrhea, states stools are watery and yellow.
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
## 2017-05-27 NOTE — Discharge Instructions (Signed)
You have been evaluated for your condition.  Fortunately no evidence of complication from recent endoscopy.  However, your urine shows sign of a urinary tract infection.  Please take antibiotic as prescribed for the full duration and follow up with your doctor for further care.  Return to the ER if you have any concerns.

## 2017-06-06 DIAGNOSIS — R1013 Epigastric pain: Secondary | ICD-10-CM | POA: Diagnosis not present

## 2017-06-06 DIAGNOSIS — K625 Hemorrhage of anus and rectum: Secondary | ICD-10-CM | POA: Diagnosis not present

## 2017-06-06 DIAGNOSIS — R112 Nausea with vomiting, unspecified: Secondary | ICD-10-CM | POA: Diagnosis not present

## 2017-07-12 ENCOUNTER — Emergency Department (HOSPITAL_COMMUNITY): Payer: Medicare Other

## 2017-07-12 ENCOUNTER — Other Ambulatory Visit: Payer: Self-pay

## 2017-07-12 ENCOUNTER — Inpatient Hospital Stay (HOSPITAL_COMMUNITY)
Admission: EM | Admit: 2017-07-12 | Discharge: 2017-07-16 | DRG: 690 | Disposition: A | Discharge status: Home / Self Care | Payer: Medicare Other | Attending: Family Medicine | Admitting: Family Medicine

## 2017-07-12 ENCOUNTER — Encounter (HOSPITAL_COMMUNITY): Payer: Self-pay | Admitting: *Deleted

## 2017-07-12 DIAGNOSIS — K219 Gastro-esophageal reflux disease without esophagitis: Secondary | ICD-10-CM | POA: Diagnosis present

## 2017-07-12 DIAGNOSIS — Z79899 Other long term (current) drug therapy: Secondary | ICD-10-CM | POA: Diagnosis not present

## 2017-07-12 DIAGNOSIS — K7030 Alcoholic cirrhosis of liver without ascites: Secondary | ICD-10-CM | POA: Diagnosis not present

## 2017-07-12 DIAGNOSIS — B9620 Unspecified Escherichia coli [E. coli] as the cause of diseases classified elsewhere: Secondary | ICD-10-CM | POA: Diagnosis present

## 2017-07-12 DIAGNOSIS — R112 Nausea with vomiting, unspecified: Secondary | ICD-10-CM

## 2017-07-12 DIAGNOSIS — K7469 Other cirrhosis of liver: Secondary | ICD-10-CM | POA: Diagnosis not present

## 2017-07-12 DIAGNOSIS — E860 Dehydration: Secondary | ICD-10-CM | POA: Diagnosis present

## 2017-07-12 DIAGNOSIS — K7460 Unspecified cirrhosis of liver: Secondary | ICD-10-CM | POA: Diagnosis present

## 2017-07-12 DIAGNOSIS — R197 Diarrhea, unspecified: Secondary | ICD-10-CM

## 2017-07-12 DIAGNOSIS — D61818 Other pancytopenia: Secondary | ICD-10-CM | POA: Diagnosis not present

## 2017-07-12 DIAGNOSIS — B1920 Unspecified viral hepatitis C without hepatic coma: Secondary | ICD-10-CM | POA: Diagnosis present

## 2017-07-12 DIAGNOSIS — Z87440 Personal history of urinary (tract) infections: Secondary | ICD-10-CM | POA: Diagnosis not present

## 2017-07-12 DIAGNOSIS — F1120 Opioid dependence, uncomplicated: Secondary | ICD-10-CM | POA: Diagnosis present

## 2017-07-12 DIAGNOSIS — R1010 Upper abdominal pain, unspecified: Secondary | ICD-10-CM

## 2017-07-12 DIAGNOSIS — R0781 Pleurodynia: Secondary | ICD-10-CM | POA: Diagnosis not present

## 2017-07-12 DIAGNOSIS — R1115 Cyclical vomiting syndrome unrelated to migraine: Secondary | ICD-10-CM

## 2017-07-12 DIAGNOSIS — F1199 Opioid use, unspecified with unspecified opioid-induced disorder: Secondary | ICD-10-CM | POA: Diagnosis present

## 2017-07-12 DIAGNOSIS — G43A1 Cyclical vomiting, intractable: Secondary | ICD-10-CM | POA: Diagnosis not present

## 2017-07-12 DIAGNOSIS — N390 Urinary tract infection, site not specified: Secondary | ICD-10-CM | POA: Diagnosis not present

## 2017-07-12 DIAGNOSIS — F1190 Opioid use, unspecified, uncomplicated: Secondary | ICD-10-CM | POA: Diagnosis present

## 2017-07-12 DIAGNOSIS — S299XXA Unspecified injury of thorax, initial encounter: Secondary | ICD-10-CM | POA: Diagnosis not present

## 2017-07-12 DIAGNOSIS — K760 Fatty (change of) liver, not elsewhere classified: Secondary | ICD-10-CM | POA: Diagnosis not present

## 2017-07-12 DIAGNOSIS — R079 Chest pain, unspecified: Secondary | ICD-10-CM | POA: Diagnosis not present

## 2017-07-12 DIAGNOSIS — R1011 Right upper quadrant pain: Secondary | ICD-10-CM | POA: Diagnosis not present

## 2017-07-12 HISTORY — DX: Essential (primary) hypertension: I10

## 2017-07-12 LAB — URINALYSIS, ROUTINE W REFLEX MICROSCOPIC
Bilirubin Urine: NEGATIVE
Glucose, UA: NEGATIVE mg/dL
Ketones, ur: NEGATIVE mg/dL
Nitrite: NEGATIVE
Protein, ur: NEGATIVE mg/dL
Specific Gravity, Urine: 1.008 (ref 1.005–1.030)
pH: 6 (ref 5.0–8.0)

## 2017-07-12 LAB — LIPASE, BLOOD: Lipase: 26 U/L (ref 11–51)

## 2017-07-12 LAB — I-STAT CG4 LACTIC ACID, ED
Lactic Acid, Venous: 5.44 mmol/L (ref 0.5–1.9)
Lactic Acid, Venous: 3.47 mmol/L (ref 0.5–1.9)

## 2017-07-12 LAB — AMMONIA: Ammonia: 36 umol/L — ABNORMAL HIGH (ref 9–35)

## 2017-07-12 LAB — POC OCCULT BLOOD, ED: Fecal Occult Bld: NEGATIVE

## 2017-07-12 LAB — CBC
HCT: 43.3 % (ref 36.0–46.0)
Hemoglobin: 14.8 g/dL (ref 12.0–15.0)
MCH: 33.6 pg (ref 26.0–34.0)
MCHC: 34.2 g/dL (ref 30.0–36.0)
MCV: 98.4 fL (ref 78.0–100.0)
Platelets: 278 10*3/uL (ref 150–400)
RBC: 4.4 MIL/uL (ref 3.87–5.11)
RDW: 14.3 % (ref 11.5–15.5)
WBC: 8.2 10*3/uL (ref 4.0–10.5)

## 2017-07-12 LAB — LACTIC ACID, PLASMA
Lactic Acid, Venous: 2.1 mmol/L (ref 0.5–1.9)
Lactic Acid, Venous: 1.9 mmol/L (ref 0.5–1.9)

## 2017-07-12 LAB — COMPREHENSIVE METABOLIC PANEL
ALT: 71 U/L — ABNORMAL HIGH (ref 14–54)
AST: 199 U/L — ABNORMAL HIGH (ref 15–41)
Albumin: 2.7 g/dL — ABNORMAL LOW (ref 3.5–5.0)
Alkaline Phosphatase: 195 U/L — ABNORMAL HIGH (ref 38–126)
Anion gap: 16 — ABNORMAL HIGH (ref 5–15)
BUN: <5 mg/dL — ABNORMAL LOW (ref 6–20)
CO2: 23 mmol/L (ref 22–32)
Calcium: 8.8 mg/dL — ABNORMAL LOW (ref 8.9–10.3)
Chloride: 99 mmol/L — ABNORMAL LOW (ref 101–111)
Creatinine, Ser: 0.84 mg/dL (ref 0.44–1.00)
GFR calc Af Amer: >60 mL/min (ref >60)
GFR calc non Af Amer: >60 mL/min (ref >60)
Glucose, Bld: 109 mg/dL — ABNORMAL HIGH (ref 65–99)
Potassium: 3.6 mmol/L (ref 3.5–5.1)
Sodium: 138 mmol/L (ref 135–145)
Total Bilirubin: 1.8 mg/dL — ABNORMAL HIGH (ref 0.3–1.2)
Total Protein: 7.1 g/dL (ref 6.5–8.1)

## 2017-07-12 LAB — I-STAT TROPONIN, ED: Troponin i, poc: 0 ng/mL (ref 0.00–0.08)

## 2017-07-12 LAB — PROTIME-INR
INR: 1
Prothrombin Time: 13.2 seconds (ref 11.4–15.2)

## 2017-07-12 LAB — ETHANOL: Alcohol, Ethyl (B): <10 mg/dL (ref <10)

## 2017-07-12 MED ORDER — ONDANSETRON 4 MG PO TBDP: 4.0000 mg | ORAL_TABLET | Freq: Three times a day (TID) | ORAL | 0 refills | Status: DC | PRN

## 2017-07-12 MED ORDER — MORPHINE SULFATE (PF) 4 MG/ML IV SOLN
4.0000 mg | Freq: Once | INTRAVENOUS | Status: AC
  Administered 2017-07-12: 4 mg via INTRAVENOUS
  Filled 2017-07-12: qty 1

## 2017-07-12 MED ORDER — ACETAMINOPHEN 325 MG PO TABS: 650.0000 mg | ORAL_TABLET | Freq: Four times a day (QID) | ORAL | Status: DC | PRN

## 2017-07-12 MED ORDER — ONDANSETRON HCL 4 MG PO TABS
4.0000 mg | ORAL_TABLET | Freq: Four times a day (QID) | ORAL | Status: DC | PRN
  Administered 2017-07-15 – 2017-07-16 (×3): 4 mg via ORAL
  Filled 2017-07-12 (×4): qty 1

## 2017-07-12 MED ORDER — SULFAMETHOXAZOLE-TRIMETHOPRIM 800-160 MG PO TABS: 1.0000 | ORAL_TABLET | Freq: Two times a day (BID) | ORAL | 0 refills | Status: DC

## 2017-07-12 MED ORDER — ONDANSETRON HCL 4 MG/2ML IJ SOLN
4.0000 mg | Freq: Four times a day (QID) | INTRAMUSCULAR | Status: DC | PRN
  Administered 2017-07-13 – 2017-07-14 (×4): 4 mg via INTRAVENOUS
  Filled 2017-07-12 (×5): qty 2

## 2017-07-12 MED ORDER — SODIUM CHLORIDE 0.9 % IV SOLN
1.0000 g | INTRAVENOUS | Status: DC
  Administered 2017-07-12 – 2017-07-14 (×3): 1 g via INTRAVENOUS
  Filled 2017-07-12 (×4): qty 10

## 2017-07-12 MED ORDER — KETOROLAC TROMETHAMINE 15 MG/ML IJ SOLN
15.0000 mg | Freq: Four times a day (QID) | INTRAMUSCULAR | Status: DC | PRN
  Administered 2017-07-13 – 2017-07-16 (×7): 15 mg via INTRAVENOUS
  Filled 2017-07-12 (×7): qty 1

## 2017-07-12 MED ORDER — SODIUM CHLORIDE 0.9 % IV BOLUS (SEPSIS)
500.0000 mL | Freq: Once | INTRAVENOUS | Status: AC
  Administered 2017-07-12: 500 mL via INTRAVENOUS

## 2017-07-12 MED ORDER — LACTATED RINGERS IV SOLN
INTRAVENOUS | Status: DC
  Administered 2017-07-12 – 2017-07-15 (×7): via INTRAVENOUS

## 2017-07-12 MED ORDER — ENOXAPARIN SODIUM 40 MG/0.4ML ~~LOC~~ SOLN
40.0000 mg | SUBCUTANEOUS | Status: DC
  Filled 2017-07-12 (×3): qty 0.4

## 2017-07-12 MED ORDER — ONDANSETRON HCL 4 MG/2ML IJ SOLN
4.0000 mg | Freq: Once | INTRAMUSCULAR | Status: AC
  Administered 2017-07-12: 4 mg via INTRAVENOUS
  Filled 2017-07-12: qty 2

## 2017-07-12 MED ORDER — METOCLOPRAMIDE HCL 5 MG/ML IJ SOLN
10.0000 mg | Freq: Three times a day (TID) | INTRAMUSCULAR | Status: DC
  Administered 2017-07-12 – 2017-07-13 (×3): 10 mg via INTRAVENOUS
  Filled 2017-07-12 (×3): qty 2

## 2017-07-12 MED ORDER — CARVEDILOL 12.5 MG PO TABS
12.5000 mg | ORAL_TABLET | Freq: Two times a day (BID) | ORAL | Status: DC
  Administered 2017-07-13 – 2017-07-16 (×7): 12.5 mg via ORAL
  Filled 2017-07-12 (×9): qty 1

## 2017-07-12 MED ORDER — ACETAMINOPHEN 650 MG RE SUPP: 650.0000 mg | Freq: Four times a day (QID) | RECTAL | Status: DC | PRN

## 2017-07-12 MED ORDER — SODIUM CHLORIDE 0.9% FLUSH
3.0000 mL | Freq: Two times a day (BID) | INTRAVENOUS | Status: DC
  Administered 2017-07-14 – 2017-07-16 (×3): 3 mL via INTRAVENOUS

## 2017-07-12 MED ORDER — IOPAMIDOL (ISOVUE-300) INJECTION 61%
INTRAVENOUS | Status: AC
  Administered 2017-07-12: 100 mL
  Filled 2017-07-12: qty 100

## 2017-07-12 MED ORDER — CARVEDILOL 12.5 MG PO TABS: 12.5000 mg | ORAL_TABLET | Freq: Two times a day (BID) | ORAL | Status: DC

## 2017-07-12 MED ORDER — SODIUM CHLORIDE 0.9 % IV BOLUS (SEPSIS)
1000.0000 mL | Freq: Once | INTRAVENOUS | Status: AC
  Administered 2017-07-12: 1000 mL via INTRAVENOUS

## 2017-07-12 MED ORDER — MORPHINE SULFATE (PF) 4 MG/ML IV SOLN
2.0000 mg | INTRAVENOUS | Status: DC | PRN
  Administered 2017-07-12: 2 mg via INTRAVENOUS
  Filled 2017-07-12: qty 1

## 2017-07-12 MED ORDER — MORPHINE SULFATE (PF) 4 MG/ML IV SOLN: 2.0000 mg | INTRAVENOUS | Status: DC | PRN

## 2017-07-12 MED ORDER — SUCRALFATE 1 G PO TABS
1.0000 g | ORAL_TABLET | Freq: Three times a day (TID) | ORAL | Status: DC
  Filled 2017-07-12: qty 1

## 2017-07-12 NOTE — Progress Notes (Signed)
Pharmacy Antibiotic Note  Jamie [name] is a 52 y.o. female admitted on 07/12/2017 with UTI.  Pharmacy has been consulted for ceftriaxone dosing.  Has dysuria despite treatment with Keflex in February. WBC WNL. Afebrile. UA is large leukocyte, negative nitrite, and few bacteria.   No renal adjustment needed for ceftriaxone and frequency appropriate for indication.  Plan: Ceftriaxone 1 g IV every 24 hours Monitor clinical pic, cx results, and LOT    Temp (24hrs), Avg:98.2 F (36.8 C), Min:98.2 F (36.8 C), Max:98.2 F (36.8 C)  Recent Labs  Lab 07/12/17 0900 07/12/17 0908 07/12/17 1146  WBC 8.2  --   --   CREATININE 0.84  --   --   LATICACIDVEN  --  5.44* 3.47*    CrCl cannot be calculated (Unknown ideal weight.).    No Known Allergies  Antimicrobials this admission: Ceftriaxone 3/19 >>   Dose adjustments this admission: N/A  Microbiology results: 3/19 UCx: sent   Thank you for allowing pharmacy to be a part of this patient's care.  Doylene Canard, PharmD Clinical Pharmacist  Pager: [phone] Phone: [phone] 07/12/2017 3:27 PM

## 2017-07-12 NOTE — Discharge Instructions (Addendum)
There were no acute abnormalities on the CT scan.    Hydration: Symptoms will be intensified and complicated by dehydration. Dehydration can also extend the duration of symptoms. Drink plenty of fluids and get plenty of rest. You should be drinking at least half a liter of water an hour to stay hydrated. Electrolyte drinks (ex. Gatorade, Powerade, Pedialyte) are also encouraged. You should be drinking enough fluids to make your urine light yellow, almost clear. If this is not the case, you are not drinking enough water.  Please note that some of the treatments indicated below will not be effective if you are not adequately hydrated.  Diet: Please concentrate on hydration, however, you may introduce food slowly.  Start with a clear liquid diet, progressed to a full liquid diet, and then bland solids as you are able.  Pain or fever: Ibuprofen or Naproxen for pain or fever.   Nausea/vomiting: Use the Zofran for nausea or vomiting.   Follow up: Follow up with the gastroenterologist as soon as possible regarding the liver enzyme abnormalities.  Please also follow-up with your primary care provider regarding the UTI symptoms.  Please take all of your antibiotics until finished!   You may develop abdominal discomfort or diarrhea from the antibiotic.  You may help offset this with probiotics which you can buy or get in yogurt. Do not eat or take the probiotics until 2 hours after your antibiotic.

## 2017-07-12 NOTE — ED Notes (Signed)
Pt given ice water, per Dr. Wilson Singer.

## 2017-07-12 NOTE — ED Notes (Addendum)
Pt also states right sided rib pain from a mechanical fall.

## 2017-07-12 NOTE — ED Notes (Signed)
ED Provider at bedside. 

## 2017-07-12 NOTE — Progress Notes (Signed)
Jamie [name] is a 52 y.o. female patient admitted from ED awake, alert - oriented  X 4 - no acute distress noted.  VSS - Blood pressure 124/64, pulse 88, temperature 98.2 F (36.8 C), temperature source Oral, resp. rate 17, SpO2 99 %.    IV in place, occlusive dsg intact without redness.  Orientation to room, and floor completed with information packet given to patient/family.  Patient declined safety video at this time.  Admission INP armband ID verified with patient/family, and in place.   SR up x 2, fall assessment complete, with patient and family able to verbalize understanding of risk associated with falls, and verbalized understanding to call nsg before up out of bed.  Call light within reach, patient able to voice, and demonstrate understanding.  Skin, clean-dry- intact without evidence of bruising, or skin tears.   No evidence of skin break down noted on exam.     Will cont to eval and treat per MD orders.  Luci Bank, RN 07/12/2017 6:59 PM
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
## 2017-07-12 NOTE — ED Triage Notes (Signed)
Pt reports not feeling well for over the past week. Having lower abd pain, n/v/d and generalized fatigue. Pt reports also tripping and falling and now has right rib pain. HR 137 at triage.
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
## 2017-07-12 NOTE — Progress Notes (Signed)
CRITICAL VALUE ALERT  Critical Value: lactic acid 2.1  Date & Time Notied:  07/12/17 at 2110  Provider Notified: K. schorr  Orders Received/Actions taken: NS 500 ml bolus given.

## 2017-07-13 ENCOUNTER — Encounter (HOSPITAL_COMMUNITY): Payer: Self-pay | Admitting: General Practice

## 2017-07-13 DIAGNOSIS — R112 Nausea with vomiting, unspecified: Secondary | ICD-10-CM | POA: Diagnosis not present

## 2017-07-13 DIAGNOSIS — D61818 Other pancytopenia: Secondary | ICD-10-CM | POA: Diagnosis not present

## 2017-07-13 DIAGNOSIS — E860 Dehydration: Secondary | ICD-10-CM

## 2017-07-13 DIAGNOSIS — B9620 Unspecified Escherichia coli [E. coli] as the cause of diseases classified elsewhere: Secondary | ICD-10-CM | POA: Diagnosis not present

## 2017-07-13 DIAGNOSIS — F1120 Opioid dependence, uncomplicated: Secondary | ICD-10-CM | POA: Diagnosis not present

## 2017-07-13 DIAGNOSIS — G43A1 Cyclical vomiting, intractable: Secondary | ICD-10-CM | POA: Diagnosis not present

## 2017-07-13 DIAGNOSIS — B1920 Unspecified viral hepatitis C without hepatic coma: Secondary | ICD-10-CM | POA: Diagnosis not present

## 2017-07-13 DIAGNOSIS — K7469 Other cirrhosis of liver: Secondary | ICD-10-CM

## 2017-07-13 DIAGNOSIS — F1199 Opioid use, unspecified with unspecified opioid-induced disorder: Secondary | ICD-10-CM | POA: Diagnosis not present

## 2017-07-13 DIAGNOSIS — K219 Gastro-esophageal reflux disease without esophagitis: Secondary | ICD-10-CM | POA: Diagnosis not present

## 2017-07-13 DIAGNOSIS — Z87440 Personal history of urinary (tract) infections: Secondary | ICD-10-CM | POA: Diagnosis not present

## 2017-07-13 DIAGNOSIS — Z79899 Other long term (current) drug therapy: Secondary | ICD-10-CM | POA: Diagnosis not present

## 2017-07-13 DIAGNOSIS — K7460 Unspecified cirrhosis of liver: Secondary | ICD-10-CM | POA: Diagnosis not present

## 2017-07-13 DIAGNOSIS — K7030 Alcoholic cirrhosis of liver without ascites: Secondary | ICD-10-CM | POA: Diagnosis not present

## 2017-07-13 DIAGNOSIS — R197 Diarrhea, unspecified: Secondary | ICD-10-CM | POA: Diagnosis not present

## 2017-07-13 DIAGNOSIS — N390 Urinary tract infection, site not specified: Secondary | ICD-10-CM | POA: Diagnosis not present

## 2017-07-13 LAB — COMPREHENSIVE METABOLIC PANEL
ALT: 45 U/L (ref 14–54)
AST: 121 U/L — ABNORMAL HIGH (ref 15–41)
Albumin: 1.9 g/dL — ABNORMAL LOW (ref 3.5–5.0)
Alkaline Phosphatase: 145 U/L — ABNORMAL HIGH (ref 38–126)
Anion gap: 10 (ref 5–15)
BUN: <5 mg/dL — ABNORMAL LOW (ref 6–20)
CO2: 22 mmol/L (ref 22–32)
Calcium: 7.7 mg/dL — ABNORMAL LOW (ref 8.9–10.3)
Chloride: 101 mmol/L (ref 101–111)
Creatinine, Ser: 0.65 mg/dL (ref 0.44–1.00)
GFR calc Af Amer: >60 mL/min (ref >60)
GFR calc non Af Amer: >60 mL/min (ref >60)
Glucose, Bld: 84 mg/dL (ref 65–99)
Potassium: 3.6 mmol/L (ref 3.5–5.1)
Sodium: 133 mmol/L — ABNORMAL LOW (ref 135–145)
Total Bilirubin: 1.4 mg/dL — ABNORMAL HIGH (ref 0.3–1.2)
Total Protein: 4.9 g/dL — ABNORMAL LOW (ref 6.5–8.1)

## 2017-07-13 LAB — CBC
HCT: 30.5 % — ABNORMAL LOW (ref 36.0–46.0)
Hemoglobin: 10.3 g/dL — ABNORMAL LOW (ref 12.0–15.0)
MCH: 33.1 pg (ref 26.0–34.0)
MCHC: 33.8 g/dL (ref 30.0–36.0)
MCV: 98.1 fL (ref 78.0–100.0)
Platelets: 143 10*3/uL — ABNORMAL LOW (ref 150–400)
RBC: 3.11 MIL/uL — ABNORMAL LOW (ref 3.87–5.11)
RDW: 14.3 % (ref 11.5–15.5)
WBC: 3.5 10*3/uL — ABNORMAL LOW (ref 4.0–10.5)

## 2017-07-13 MED ORDER — BOOST / RESOURCE BREEZE PO LIQD CUSTOM
1.0000 | Freq: Three times a day (TID) | ORAL | Status: DC
  Administered 2017-07-13 – 2017-07-15 (×2): 1 via ORAL

## 2017-07-13 MED ORDER — PANTOPRAZOLE SODIUM 40 MG IV SOLR
40.0000 mg | Freq: Two times a day (BID) | INTRAVENOUS | Status: DC
  Administered 2017-07-13 – 2017-07-15 (×5): 40 mg via INTRAVENOUS
  Filled 2017-07-13 (×5): qty 40

## 2017-07-13 MED ORDER — METOCLOPRAMIDE HCL 5 MG/ML IJ SOLN
10.0000 mg | Freq: Four times a day (QID) | INTRAMUSCULAR | Status: DC
  Administered 2017-07-13 – 2017-07-16 (×11): 10 mg via INTRAVENOUS
  Filled 2017-07-13 (×11): qty 2

## 2017-07-13 NOTE — Plan of Care (Signed)
Progressing

## 2017-07-13 NOTE — Progress Notes (Signed)
Initial Nutrition Assessment  DOCUMENTATION CODES:   Not applicable  INTERVENTION:  Continue Boost Breeze po TID, each supplement provides 250 kcal and 9 grams of protein per MD  NUTRITION DIAGNOSIS:   Inadequate oral intake related to vomiting, nausea, poor appetite as evidenced by meal completion < 25%.  GOAL:   Patient will meet greater than or equal to 90% of their needs  MONITOR:   PO intake, Labs, I & O's, Supplement acceptance, Weight trends  REASON FOR ASSESSMENT:   Malnutrition Screening Tool    ASSESSMENT:   Jamie [name] is a 52 yo female with PMH Hep C, Bipolar, cirrhosis, and h/o opiate dependence on suboxone until a few months ago, presents with abdominal pain, nausea and vomiting. Went 5 days without eating. Possible UTI and   Spoke with patient at bedside. She reports usual body weight of 136 pounds, no weight upon admit. Bed weight was 126 pounds today. She believes she lost this weight over the week.  Did not eat anything or drink anything morning related to nausea and vomiting still. Has been dealing with this for 6 days now with no PO intake. RD will monitor diet advancement and provide ONS as tolerated. Discussed plan with RN.  Labs reviewed:  Na 133, TBili 1.4  Medications reviewed and include:  Reglan LR at 181mL/hr  NUTRITION - FOCUSED PHYSICAL EXAM:    Most Recent Value  Orbital Region  Mild depletion  Upper Arm Region  Moderate depletion  Thoracic and Lumbar Region  No depletion  Buccal Region  No depletion  Temple Region  Moderate depletion  Clavicle Bone Region  No depletion  Clavicle and Acromion Bone Region  No depletion  Scapular Bone Region  No depletion  Dorsal Hand  Mild depletion  Patellar Region  Moderate depletion  Anterior Thigh Region  Moderate depletion  Posterior Calf Region  Moderate depletion  Edema (RD Assessment)  None       Diet Order:  Diet clear liquid Room service appropriate? Yes; Fluid consistency:  Thin  EDUCATION NEEDS:   No education needs have been identified at this time  Skin:  Skin Assessment: Reviewed RN Assessment  Last BM:  07/12/2017  Height:   Ht Readings from Last 1 Encounters:  04/20/17 5\' 4"  (1.626 m)    Weight:   Wt Readings from Last 1 Encounters:  04/20/17 136 lb (61.7 kg)    Ideal Body Weight:  54.54 kg  BMI:  There is no height or weight on file to calculate BMI.  Estimated Nutritional Needs:   Kcal:  1550-1850 calories  Protein:  80-93 grams (1.2-1.5g/kg)  Fluid:  1.6-1.9L  Satira Anis. Marcelyn Ruppe, MS, RD LDN Inpatient Clinical Dietitian Pager [phone]

## 2017-07-13 NOTE — Progress Notes (Addendum)
PROGRESS NOTE    Jamie [name]  KKX:[id number] DOB: [birth year] DOA: 07/12/2017 PCP: Helen Hashimoto., MD   Outpatient Specialists:     Brief Narrative:  Jamie [name] is a 52 y.o. female with medical history significant of Hep C, bipolar, cirrhosis, and h/o opiate dependence on suboxone until a few months ago presenting with abdominal pain and n/v.  She got sick a couple of weeks ago - feeling not real good, nauseated.  It accelerated into diarrhea.  Today is day 5 without eating.  She has been trying to drink water but she is throwing it up.  She feels so thirsty.  She has mid-lower abdominal pain for about 5 days, worse since yesterday.  She fell and hurt her rib.  She was having bad diarrhea and got up overnight to go to the bathroom and she tripped over her bed.  She landed on her right ribcage.  Se has a h/o many UTIs and is having back pain and is having to force the urine to come out.  It feels like she has a UTI.  No fever.  She gets cold and has chills, but subjective - does not own a thermometer.    She was previously on suboxone but has not been taking it for several months - she has been sick and going to the doctor and has not needed to take it.  She has had chronic vomiting for several months.  Dr. Paulita Fujita was planning to do EGD and c-scope but she was too sick to drink the bowel prep.       Assessment & Plan:   Principal Problem:   Acute lower UTI Active Problems:   Hepatic cirrhosis (HCC)   Opioid use disorder (HCC)   Dehydration   Refractory nausea and vomiting   UTI -Patient presenting with acute on chronic abdominal pain with urinary symptoms and abnormal UA - appears to be a UTI -She reports R>L flank pain but CT does not show obvious infection or obstruction so suspect lower tract UTI -Urine culture pending -continue rocephin -urine probe for G/C  Dehydration -She has chronic n/v (see below) but this is likely exacerbated in the setting of  UTI -Elevated anion gap and mild renal dysfunction are likely due to mild dehydration -She was quite tachycardic upon arrival in the ER but this has improved significantly  -Continue IV hydration-- still dehydrated appearing  Refractory n/v -Patient with reported chronic n/v -She is seeing Dr. Paulita Fujita as an outpatient for this issue -IV reglan-- monitor Qtc on tele -recent outpatient CT scan of brain w/o lesions  Cirrhosis -Elevated LFTs -- ? dehydration  Opiate dependence -reports that she is no longer taking suboxone -Her last rx was filled on 05/10/17, which is consistent with her history -Limit narcotic pain medication as much as possible     DVT prophylaxis:  Lovenox   Code Status: Full Code   Family Communication:   Disposition Plan:     Consultants:      Subjective: Still with nausea and vomiting-- even just taking in water makes her sick-- pain medications made worse  Objective: Vitals:   07/12/17 1830 07/12/17 1901 07/12/17 2116 07/13/17 0512  BP: 124/64 130/71 111/61 128/82  Pulse: 88 (!) 113 (!) 104 (!) 101  Resp: 17  18 18   Temp:  99 F (37.2 C) 98.8 F (37.1 C) 98.1 F (36.7 C)  TempSrc:  Oral Oral Oral  SpO2: 99% 99% 100% 100%    Intake/Output  Summary (Last 24 hours) at 07/13/2017 1354 Last data filed at 07/13/2017 1011 Gross per 24 hour  Intake 2692.08 ml  Output 500 ml  Net 2192.08 ml   There were no vitals filed for this visit.  Examination:  General exam: Appears calm and comfortable  Respiratory system: Clear to auscultation. Respiratory effort normal. Cardiovascular system: S1 & S2 heard, RRR. No JVD, murmurs, rubs, gallops or clicks. No pedal edema. Gastrointestinal system: Abdomen is nondistended, soft and nontender. No organomegaly or masses felt. Normal bowel sounds heard. Central nervous system: Alert and oriented. No focal neurological deficits. Extremities: Symmetric 5 x 5 power. Skin: No rashes, lesions or  ulcers Psychiatry: Judgement and insight appear normal. Mood & affect appropriate.     Data Reviewed: I have personally reviewed following labs and imaging studies  CBC: Recent Labs  Lab 07/12/17 0900 07/13/17 0930  WBC 8.2 3.5*  HGB 14.8 10.3*  HCT 43.3 30.5*  MCV 98.4 98.1  PLT 278 854*   Basic Metabolic Panel: Recent Labs  Lab 07/12/17 0900 07/13/17 0740  NA 138 133*  K 3.6 3.6  CL 99* 101  CO2 23 22  GLUCOSE 109* 84  BUN <5* <5*  CREATININE 0.84 0.65  CALCIUM 8.8* 7.7*   GFR: CrCl cannot be calculated (Unknown ideal weight.). Liver Function Tests: Recent Labs  Lab 07/12/17 0900 07/13/17 0740  AST 199* 121*  ALT 71* 45  ALKPHOS 195* 145*  BILITOT 1.8* 1.4*  PROT 7.1 4.9*  ALBUMIN 2.7* 1.9*   Recent Labs  Lab 07/12/17 0900  LIPASE 26   Recent Labs  Lab 07/12/17 1023  AMMONIA 36*   Coagulation Profile: Recent Labs  Lab 07/12/17 1022  INR 1.00   Cardiac Enzymes: No results for input(s): CKTOTAL, CKMB, CKMBINDEX, TROPONINI in the last 168 hours. BNP (last 3 results) No results for input(s): PROBNP in the last 8760 hours. HbA1C: No results for input(s): HGBA1C in the last 72 hours. CBG: No results for input(s): GLUCAP in the last 168 hours. Lipid Profile: No results for input(s): CHOL, HDL, LDLCALC, TRIG, CHOLHDL, LDLDIRECT in the last 72 hours. Thyroid Function Tests: No results for input(s): TSH, T4TOTAL, FREET4, T3FREE, THYROIDAB in the last 72 hours. Anemia Panel: No results for input(s): VITAMINB12, FOLATE, FERRITIN, TIBC, IRON, RETICCTPCT in the last 72 hours. Urine analysis:    Component Value Date/Time   COLORURINE AMBER (A) 07/12/2017 0907   APPEARANCEUR CLOUDY (A) 07/12/2017 0907   LABSPEC 1.008 07/12/2017 0907   PHURINE 6.0 07/12/2017 0907   GLUCOSEU NEGATIVE 07/12/2017 0907   HGBUR SMALL (A) 07/12/2017 0907   BILIRUBINUR NEGATIVE 07/12/2017 0907   KETONESUR NEGATIVE 07/12/2017 0907   PROTEINUR NEGATIVE 07/12/2017 0907    UROBILINOGEN 0.2 06/23/2011 0640   NITRITE NEGATIVE 07/12/2017 0907   LEUKOCYTESUR LARGE (A) 07/12/2017 0907    )No results found for this or any previous visit (from the past 240 hour(s)).    Anti-infectives (From admission, onward)   Start     Dose/Rate Route Frequency Ordered Stop   07/12/17 1600  cefTRIAXone (ROCEPHIN) 1 g in sodium chloride 0.9 % 100 mL IVPB     1 g 200 mL/hr over 30 Minutes Intravenous Every 24 hours 07/12/17 1526     07/12/17 0000  sulfamethoxazole-trimethoprim (BACTRIM DS,SEPTRA DS) 800-160 MG tablet     1 tablet Oral 2 times daily 07/12/17 1326 07/19/17 2359       Radiology Studies: Dg Ribs Unilateral W/chest Right  Result Date: 07/12/2017 CLINICAL DATA:  Fell with right-sided chest pain. EXAM: RIGHT RIBS AND CHEST - 3+ VIEW COMPARISON:  05/27/2017 FINDINGS: Artifact overlies the chest. Heart size is normal. Mediastinal shadows are normal. No pneumothorax or hemothorax. Bilateral breast implants. Right rib films do not show any fracture allowing for the overlying artifact. IMPRESSION: No active disease. No right rib fracture seen. No evidence any other chest disease. Electronically Signed   By: Nelson Chimes M.D.   On: 07/12/2017 11:06   Ct Abdomen Pelvis W Contrast  Result Date: 07/12/2017 CLINICAL DATA:  Mid and upper abdominal pain with nausea for 3 weeks. Symptoms have worsened over the past 3 days. EXAM: CT ABDOMEN AND PELVIS WITH CONTRAST TECHNIQUE: Multidetector CT imaging of the abdomen and pelvis was performed using the standard protocol following bolus administration of intravenous contrast. CONTRAST:  100 cc ISOVUE-300 IOPAMIDOL (ISOVUE-300) INJECTION 61% COMPARISON:  CT abdomen and pelvis 05/27/2017. FINDINGS: Lower chest: Bilateral breast implants noted. Mild dependent atelectasis is seen. No pleural or pericardial effusion. Hepatobiliary: There is diffuse fatty infiltration of the liver. No focal liver lesion. The patient is status post  cholecystectomy. Biliary tree is unremarkable. Pancreas: Unremarkable. No pancreatic ductal dilatation or surrounding inflammatory changes. Spleen: Normal in size without focal abnormality. Adrenals/Urinary Tract: Adrenal glands are unremarkable. Kidneys are normal, without renal calculi, focal lesion, or hydronephrosis. Bladder is unremarkable. Stomach/Bowel: Status post appendectomy. The colon is largely decompressed. Fatty infiltration of the walls of the ascending and transverse colon is most consistent with chronic/remote inflammatory change and stable in appearance. Small hiatal hernia is noted. The stomach is otherwise unremarkable. Small bowel appears normal. Vascular/Lymphatic: Aortic atherosclerosis. No enlarged abdominal or pelvic lymph nodes. Reproductive: Status post hysterectomy. No adnexal masses. Other: No ascites. Musculoskeletal: Mild, remote anterior, superior endplate compression fracture of L4 is unchanged. IMPRESSION: No acute abnormality abdomen or pelvis. Diffuse fatty infiltration of the liver. Small hiatal hernia. Atherosclerosis. Fatty infiltration of the walls of the ascending and transverse colon is consistent with chronic/remote inflammatory process. Electronically Signed   By: Inge Rise M.D.   On: 07/12/2017 12:54        Scheduled Meds: . carvedilol  12.5 mg Oral BID WC  . enoxaparin (LOVENOX) injection  40 mg Subcutaneous Q24H  . feeding supplement  1 Container Oral TID BM  . metoCLOPramide (REGLAN) injection  10 mg Intravenous Q6H  . pantoprazole (PROTONIX) IV  40 mg Intravenous Q12H  . sodium chloride flush  3 mL Intravenous Q12H   Continuous Infusions: . cefTRIAXone (ROCEPHIN)  IV Stopped (07/12/17 1825)  . lactated ringers 125 mL/hr at 07/13/17 0315     LOS: 0 days    Time spent: 35 min    Geradine Girt, DO [facility] Pager [phone]  If 7PM-7AM, please contact night-coverage [email or web address] Password [email or web address] 07/13/2017, 1:54 PM

## 2017-07-14 DIAGNOSIS — K7030 Alcoholic cirrhosis of liver without ascites: Secondary | ICD-10-CM

## 2017-07-14 DIAGNOSIS — R197 Diarrhea, unspecified: Secondary | ICD-10-CM

## 2017-07-14 LAB — BASIC METABOLIC PANEL
Anion gap: 9 (ref 5–15)
BUN: <5 mg/dL — ABNORMAL LOW (ref 6–20)
CO2: 23 mmol/L (ref 22–32)
Calcium: 7.5 mg/dL — ABNORMAL LOW (ref 8.9–10.3)
Chloride: 105 mmol/L (ref 101–111)
Creatinine, Ser: 0.66 mg/dL (ref 0.44–1.00)
GFR calc Af Amer: >60 mL/min (ref >60)
GFR calc non Af Amer: >60 mL/min (ref >60)
Glucose, Bld: 88 mg/dL (ref 65–99)
Potassium: 3.2 mmol/L — ABNORMAL LOW (ref 3.5–5.1)
Sodium: 137 mmol/L (ref 135–145)

## 2017-07-14 LAB — CBC
HCT: 30.2 % — ABNORMAL LOW (ref 36.0–46.0)
Hemoglobin: 10.4 g/dL — ABNORMAL LOW (ref 12.0–15.0)
MCH: 33.4 pg (ref 26.0–34.0)
MCHC: 34.4 g/dL (ref 30.0–36.0)
MCV: 97.1 fL (ref 78.0–100.0)
Platelets: 147 10*3/uL — ABNORMAL LOW (ref 150–400)
RBC: 3.11 MIL/uL — ABNORMAL LOW (ref 3.87–5.11)
RDW: 13.8 % (ref 11.5–15.5)
WBC: 2.9 10*3/uL — ABNORMAL LOW (ref 4.0–10.5)

## 2017-07-14 LAB — GC/CHLAMYDIA PROBE AMP (~~LOC~~) NOT AT ARMC
Chlamydia: NEGATIVE
Neisseria Gonorrhea: NEGATIVE

## 2017-07-14 MED ORDER — ZOLPIDEM TARTRATE 5 MG PO TABS
5.0000 mg | ORAL_TABLET | Freq: Every evening | ORAL | Status: DC | PRN
  Administered 2017-07-14 – 2017-07-15 (×2): 5 mg via ORAL
  Filled 2017-07-14 (×2): qty 1

## 2017-07-14 MED ORDER — POTASSIUM CHLORIDE CRYS ER 20 MEQ PO TBCR
40.0000 meq | EXTENDED_RELEASE_TABLET | Freq: Once | ORAL | Status: AC
  Administered 2017-07-14: 40 meq via ORAL
  Filled 2017-07-14: qty 2

## 2017-07-14 NOTE — Progress Notes (Signed)
PROGRESS NOTE    Jamie [name]  [id number]:[id number] DOB: 1965/12/14 DOA: 07/12/2017 PCP: Helen Hashimoto., MD   Outpatient Specialists:     Brief Narrative:  Jamie [name] is a 52 y.o. female with medical history significant of Hep C, bipolar, cirrhosis, and h/o opiate dependence on suboxone until a few months ago presenting with abdominal pain and n/v.  She got sick a couple of weeks ago - feeling not real good, nauseated.  It accelerated into diarrhea.  Today is day 5 without eating.  She has been trying to drink water but she is throwing it up.  Dr. Paulita Fujita was planning to do EGD and c-scope but she was too sick to drink the bowel prep.  Here she was found to have UTI and be dehydrated.  Slowly improving, suspect can be d/c'd in next 24 hours     Assessment & Plan:   Principal Problem:   Acute lower UTI Active Problems:   Hepatic cirrhosis (HCC)   Opioid use disorder (HCC)   Dehydration   Refractory nausea and vomiting   UTI- gram negative rod  -Patient presenting with acute on chronic abdominal pain with urinary symptoms and abnormal UA - appears to be a UTI -She reports R>L flank pain but CT does not show obvious infection or obstruction so suspect lower tract UTI -continue rocephin -urine probe for G/C pending  Dehydration -She has chronic n/v (see below) but this is likely exacerbated in the setting of UTI -Elevated anion gap and mild renal dysfunction are likely due to mild dehydration -She was quite tachycardic upon arrival in the ER but this has improved significantly  -Continue IV hydration-- still dehydrated appearing  Refractory n/v -Patient with reported chronic n/v -She is seeing Dr. Paulita Fujita as an outpatient for this issue -IV reglan-- monitor Qtc with EKG in AM -recent outpatient CT scan of brain w/o lesions -IV protonix added yesterday with improvement in symptoms  Cirrhosis -Elevated LFTs -- ? Dehydration-- patient did accidentally have an  alcoholic beverage at a party (spiked punch)  Opiate dependence -reports that she is no longer taking suboxone -Her last rx was filled on 05/10/17, which is consistent with her history -Limit narcotic pain medication as much as possible  Pancytopenia -?cirrhosis-- monitor with labs    DVT prophylaxis:  Lovenox   Code Status: Full Code   Family Communication:   Disposition Plan:  Home 24-48 hours   Consultants:      Subjective: Able to tolerate some PO-- broth Feels like after protonix she felt better  Objective: Vitals:   07/13/17 2017 07/13/17 2123 07/14/17 0521 07/14/17 0802  BP:  132/70 136/82 129/74  Pulse:  81 81 78  Resp:  18 18   Temp:  98 F (36.7 C) 98.3 F (36.8 C)   TempSrc:  Oral Oral   SpO2:  97% 98%   Weight: 56.1 kg (123 lb 10.9 oz)     Height: 5\' 4"  (1.626 m)       Intake/Output Summary (Last 24 hours) at 07/14/2017 1237 Last data filed at 07/14/2017 0606 Gross per 24 hour  Intake 2595.83 ml  Output -  Net 2595.83 ml   Filed Weights   07/13/17 2017  Weight: 56.1 kg (123 lb 10.9 oz)    Examination:  General exam: in bed, waxy skin appearance, NAD Respiratory system: no wheezing, no increase work of breathing Cardiovascular system: rrr Gastrointestinal system: +BS, NT Central nervous system: A+Ox3 Extremities: moves all 4 ext  Data Reviewed: I have personally reviewed following labs and imaging studies  CBC: Recent Labs  Lab 07/12/17 0900 07/13/17 0930 07/14/17 0621  WBC 8.2 3.5* 2.9*  HGB 14.8 10.3* 10.4*  HCT 43.3 30.5* 30.2*  MCV 98.4 98.1 97.1  PLT 278 143* 710*   Basic Metabolic Panel: Recent Labs  Lab 07/12/17 0900 07/13/17 0740 07/14/17 0621  NA 138 133* 137  K 3.6 3.6 3.2*  CL 99* 101 105  CO2 23 22 23   GLUCOSE 109* 84 88  BUN <5* <5* <5*  CREATININE 0.84 0.65 0.66  CALCIUM 8.8* 7.7* 7.5*   GFR: Estimated Creatinine Clearance: 71.8 mL/min (by C-G formula based on SCr of 0.66 mg/dL). Liver  Function Tests: Recent Labs  Lab 07/12/17 0900 07/13/17 0740  AST 199* 121*  ALT 71* 45  ALKPHOS 195* 145*  BILITOT 1.8* 1.4*  PROT 7.1 4.9*  ALBUMIN 2.7* 1.9*   Recent Labs  Lab 07/12/17 0900  LIPASE 26   Recent Labs  Lab 07/12/17 1023  AMMONIA 36*   Coagulation Profile: Recent Labs  Lab 07/12/17 1022  INR 1.00   Cardiac Enzymes: No results for input(s): CKTOTAL, CKMB, CKMBINDEX, TROPONINI in the last 168 hours. BNP (last 3 results) No results for input(s): PROBNP in the last 8760 hours. HbA1C: No results for input(s): HGBA1C in the last 72 hours. CBG: No results for input(s): GLUCAP in the last 168 hours. Lipid Profile: No results for input(s): CHOL, HDL, LDLCALC, TRIG, CHOLHDL, LDLDIRECT in the last 72 hours. Thyroid Function Tests: No results for input(s): TSH, T4TOTAL, FREET4, T3FREE, THYROIDAB in the last 72 hours. Anemia Panel: No results for input(s): VITAMINB12, FOLATE, FERRITIN, TIBC, IRON, RETICCTPCT in the last 72 hours. Urine analysis:    Component Value Date/Time   COLORURINE AMBER (A) 07/12/2017 0907   APPEARANCEUR CLOUDY (A) 07/12/2017 0907   LABSPEC 1.008 07/12/2017 0907   PHURINE 6.0 07/12/2017 0907   GLUCOSEU NEGATIVE 07/12/2017 0907   HGBUR SMALL (A) 07/12/2017 0907   BILIRUBINUR NEGATIVE 07/12/2017 0907   KETONESUR NEGATIVE 07/12/2017 0907   PROTEINUR NEGATIVE 07/12/2017 0907   UROBILINOGEN 0.2 06/23/2011 0640   NITRITE NEGATIVE 07/12/2017 0907   LEUKOCYTESUR LARGE (A) 07/12/2017 0907    ) Recent Results (from the past 240 hour(s))  Urine culture     Status: Abnormal (Preliminary result)   Collection Time: 07/12/17  4:54 PM  Result Value Ref Range Status   Specimen Description URINE, RANDOM  Final   Special Requests NONE  Final   Culture (A)  Final    >=100,000 COLONIES/mL ESCHERICHIA COLI SUSCEPTIBILITIES TO FOLLOW Performed at [facility], [address] [place], [place] [address]    Report Status PENDING   Incomplete      Anti-infectives (From admission, onward)   Start     Dose/Rate Route Frequency Ordered Stop   07/12/17 1600  cefTRIAXone (ROCEPHIN) 1 g in sodium chloride 0.9 % 100 mL IVPB     1 g 200 mL/hr over 30 Minutes Intravenous Every 24 hours 07/12/17 1526     07/12/17 0000  sulfamethoxazole-trimethoprim (BACTRIM DS,SEPTRA DS) 800-160 MG tablet     1 tablet Oral 2 times daily 07/12/17 1326 07/19/17 2359       Radiology Studies: Ct Abdomen Pelvis W Contrast  Result Date: 07/12/2017 CLINICAL DATA:  Mid and upper abdominal pain with nausea for 3 weeks. Symptoms have worsened over the past 3 days. EXAM: CT ABDOMEN AND PELVIS WITH CONTRAST TECHNIQUE: Multidetector CT imaging  of the abdomen and pelvis was performed using the standard protocol following bolus administration of intravenous contrast. CONTRAST:  100 cc ISOVUE-300 IOPAMIDOL (ISOVUE-300) INJECTION 61% COMPARISON:  CT abdomen and pelvis 05/27/2017. FINDINGS: Lower chest: Bilateral breast implants noted. Mild dependent atelectasis is seen. No pleural or pericardial effusion. Hepatobiliary: There is diffuse fatty infiltration of the liver. No focal liver lesion. The patient is status post cholecystectomy. Biliary tree is unremarkable. Pancreas: Unremarkable. No pancreatic ductal dilatation or surrounding inflammatory changes. Spleen: Normal in size without focal abnormality. Adrenals/Urinary Tract: Adrenal glands are unremarkable. Kidneys are normal, without renal calculi, focal lesion, or hydronephrosis. Bladder is unremarkable. Stomach/Bowel: Status post appendectomy. The colon is largely decompressed. Fatty infiltration of the walls of the ascending and transverse colon is most consistent with chronic/remote inflammatory change and stable in appearance. Small hiatal hernia is noted. The stomach is otherwise unremarkable. Small bowel appears normal. Vascular/Lymphatic: Aortic atherosclerosis. No enlarged abdominal or pelvic lymph nodes.  Reproductive: Status post hysterectomy. No adnexal masses. Other: No ascites. Musculoskeletal: Mild, remote anterior, superior endplate compression fracture of L4 is unchanged. IMPRESSION: No acute abnormality abdomen or pelvis. Diffuse fatty infiltration of the liver. Small hiatal hernia. Atherosclerosis. Fatty infiltration of the walls of the ascending and transverse colon is consistent with chronic/remote inflammatory process. Electronically Signed   By: Inge Rise M.D.   On: 07/12/2017 12:54        Scheduled Meds: . carvedilol  12.5 mg Oral BID WC  . enoxaparin (LOVENOX) injection  40 mg Subcutaneous Q24H  . feeding supplement  1 Container Oral TID BM  . metoCLOPramide (REGLAN) injection  10 mg Intravenous Q6H  . pantoprazole (PROTONIX) IV  40 mg Intravenous Q12H  . sodium chloride flush  3 mL Intravenous Q12H   Continuous Infusions: . cefTRIAXone (ROCEPHIN)  IV Stopped (07/13/17 1644)  . lactated ringers 125 mL/hr at 07/14/17 1220     LOS: 1 day    Time spent: 25 min    Geradine Girt, DO [facility] Pager [phone]  If 7PM-7AM, please contact night-coverage [email or web address] Password [facility] 07/14/2017, 12:37 PM

## 2017-07-15 DIAGNOSIS — G43A1 Cyclical vomiting, intractable: Secondary | ICD-10-CM

## 2017-07-15 DIAGNOSIS — F1199 Opioid use, unspecified with unspecified opioid-induced disorder: Secondary | ICD-10-CM

## 2017-07-15 DIAGNOSIS — B9620 Unspecified Escherichia coli [E. coli] as the cause of diseases classified elsewhere: Secondary | ICD-10-CM

## 2017-07-15 LAB — COMPREHENSIVE METABOLIC PANEL
ALT: 32 U/L (ref 14–54)
AST: 71 U/L — ABNORMAL HIGH (ref 15–41)
Albumin: 2 g/dL — ABNORMAL LOW (ref 3.5–5.0)
Alkaline Phosphatase: 122 U/L (ref 38–126)
Anion gap: 8 (ref 5–15)
BUN: <5 mg/dL — ABNORMAL LOW (ref 6–20)
CO2: 22 mmol/L (ref 22–32)
Calcium: 7.8 mg/dL — ABNORMAL LOW (ref 8.9–10.3)
Chloride: 107 mmol/L (ref 101–111)
Creatinine, Ser: 0.7 mg/dL (ref 0.44–1.00)
GFR calc Af Amer: >60 mL/min (ref >60)
GFR calc non Af Amer: >60 mL/min (ref >60)
Glucose, Bld: 86 mg/dL (ref 65–99)
Potassium: 3.3 mmol/L — ABNORMAL LOW (ref 3.5–5.1)
Sodium: 137 mmol/L (ref 135–145)
Total Bilirubin: 0.8 mg/dL (ref 0.3–1.2)
Total Protein: 4.8 g/dL — ABNORMAL LOW (ref 6.5–8.1)

## 2017-07-15 LAB — DIFFERENTIAL
Basophils Absolute: 0 10*3/uL (ref 0.0–0.1)
Basophils Relative: 1 %
Eosinophils Absolute: 0.1 10*3/uL (ref 0.0–0.7)
Eosinophils Relative: 3 %
Lymphocytes Relative: 39 %
Lymphs Abs: 1.2 10*3/uL (ref 0.7–4.0)
Monocytes Absolute: 0.4 10*3/uL (ref 0.1–1.0)
Monocytes Relative: 13 %
Neutro Abs: 1.4 10*3/uL — ABNORMAL LOW (ref 1.7–7.7)
Neutrophils Relative %: 44 %

## 2017-07-15 LAB — URINE CULTURE: Culture: >=100000 — AB

## 2017-07-15 LAB — CBC
HCT: 31.8 % — ABNORMAL LOW (ref 36.0–46.0)
Hemoglobin: 10.4 g/dL — ABNORMAL LOW (ref 12.0–15.0)
MCH: 31.8 pg (ref 26.0–34.0)
MCHC: 32.7 g/dL (ref 30.0–36.0)
MCV: 97.2 fL (ref 78.0–100.0)
Platelets: 168 10*3/uL (ref 150–400)
RBC: 3.27 MIL/uL — ABNORMAL LOW (ref 3.87–5.11)
RDW: 14 % (ref 11.5–15.5)
WBC: 3.2 10*3/uL — ABNORMAL LOW (ref 4.0–10.5)

## 2017-07-15 MED ORDER — CEPHALEXIN 500 MG PO CAPS
500.0000 mg | ORAL_CAPSULE | Freq: Two times a day (BID) | ORAL | Status: DC
  Administered 2017-07-15 – 2017-07-16 (×3): 500 mg via ORAL
  Filled 2017-07-15 (×3): qty 1

## 2017-07-15 MED ORDER — PANTOPRAZOLE SODIUM 40 MG PO PACK
40.0000 mg | PACK | Freq: Two times a day (BID) | ORAL | Status: DC
  Administered 2017-07-15 – 2017-07-16 (×2): 40 mg via ORAL
  Filled 2017-07-15: qty 20

## 2017-07-15 MED ORDER — LORAZEPAM 0.5 MG PO TABS
0.5000 mg | ORAL_TABLET | Freq: Once | ORAL | Status: AC | PRN
  Administered 2017-07-15: 0.5 mg via ORAL
  Filled 2017-07-15: qty 1

## 2017-07-15 MED ORDER — POTASSIUM CHLORIDE CRYS ER 20 MEQ PO TBCR
40.0000 meq | EXTENDED_RELEASE_TABLET | Freq: Once | ORAL | Status: AC
  Administered 2017-07-15: 40 meq via ORAL
  Filled 2017-07-15: qty 2

## 2017-07-15 NOTE — Progress Notes (Addendum)
PROGRESS NOTE    Jamie [name]  ENI:[id number] DOB: April 22, 1966 DOA: 07/12/2017 PCP: Helen Hashimoto., MD   Brief Narrative: Jamie [name] is a 52 y.o. femalewith medical history significant ofHep C, bipolar, cirrhosis, and h/o opiate dependence on suboxoneuntil a few months agopresenting with abdominal pain and n/v. She got sick a couple of weeks ago - feeling not real good, nauseated. It accelerated into diarrhea. Today is day 5 without eating. She has been trying to drink water but she is throwing it up. Dr. Paulita Fujita was planning to do EGD and c-scope but she was too sick to drink the bowel prep. Here she was found to have UTI and be dehydrated.  Slowly improving. Urine culture sensitivities pending. Advancing diet.   Assessment & Plan:   Principal Problem:   Acute lower UTI Active Problems:   Hepatic cirrhosis (HCC)   Opioid use disorder (HCC)   Dehydration   Refractory nausea and vomiting   UTI secondary to E. Coli Urine culture sensitivities showing resistance only to fluoroquinolones. Symptoms improved. -Transition ceftriaxone to Keflex  Dehydration Secondary to nausea, vomiting and diarrhea. Diarrhea is chronic. Nausea and vomiting improved with treatment of UTI.  Nausea/vomiting Diarrhea Patient follows with Dr. Paulita Fujita as an outpatient. She was scheduled for EGD and colonoscopy as an outpatient but was unable to get this done secondary to worsening symptoms. Antibiotics may be worsening diarrhea vs patient recently just being on clear liquid diet. CT scan shows chronic inflammation of the colonc -Outpatient GI follow-up  Cirrhosis Chronic. Patient follows with Dr. Paulita Fujita. AST elevated on admission and is improving. Bilirubin also improved.  Opiate dependence History. No longer on suboxone  Pancytopenia Thought secondary to cirrhosis but possibly dilutional. Improved.   DVT prophylaxis: Lovenox Code Status:   Code Status: Full Code Family  Communication: None at bedside Disposition Plan: Home if tolerating oral intake   Consultants:   None  Procedures:   None  Antimicrobials:  Ceftriaxone    Subjective: Multiple bowel movements this morning that were small.  Objective: Vitals:   07/15/17 0556 07/15/17 0830 07/15/17 0900 07/15/17 0942  BP: 138/69 134/80 134/80   Pulse: 81 89 89   Resp: 17   16  Temp: 98.1 F (36.7 C) 98.4 F (36.9 C)    TempSrc: Oral Oral    SpO2: 99% 99%    Weight:      Height:        Intake/Output Summary (Last 24 hours) at 07/15/2017 1146 Last data filed at 07/15/2017 1032 Gross per 24 hour  Intake 3162.5 ml  Output 350 ml  Net 2812.5 ml   Filed Weights   07/13/17 2017  Weight: 56.1 kg (123 lb 10.9 oz)    Examination:  General exam: Appears calm and comfortable Respiratory system: Clear to auscultation. Respiratory effort normal. Cardiovascular system: S1 & S2 heard, RRR. No murmurs, rubs, gallops or clicks. Gastrointestinal system: Abdomen is slightly distended, soft and nontender. No organomegaly or masses felt. Normal bowel sounds heard. Central nervous system: Alert and oriented. No focal neurological deficits. Extremities: No edema. No calf tenderness Skin: No cyanosis. No rashes Psychiatry: Judgement and insight appear normal. Mood & affect appropriate.     Data Reviewed: I have personally reviewed following labs and imaging studies  CBC: Recent Labs  Lab 07/12/17 0900 07/13/17 0930 07/14/17 0621 07/15/17 0500  WBC 8.2 3.5* 2.9* 3.2*  HGB 14.8 10.3* 10.4* 10.4*  HCT 43.3 30.5* 30.2* 31.8*  MCV 98.4 98.1 97.1 97.2  PLT 278 143* 147* 706   Basic Metabolic Panel: Recent Labs  Lab 07/12/17 0900 07/13/17 0740 07/14/17 0621 07/15/17 0500  NA 138 133* 137 137  K 3.6 3.6 3.2* 3.3*  CL 99* 101 105 107  CO2 23 22 23 22   GLUCOSE 109* 84 88 86  BUN <5* <5* <5* <5*  CREATININE 0.84 0.65 0.66 0.70  CALCIUM 8.8* 7.7* 7.5* 7.8*   GFR: Estimated  Creatinine Clearance: 71.8 mL/min (by C-G formula based on SCr of 0.7 mg/dL). Liver Function Tests: Recent Labs  Lab 07/12/17 0900 07/13/17 0740 07/15/17 0500  AST 199* 121* 71*  ALT 71* 45 32  ALKPHOS 195* 145* 122  BILITOT 1.8* 1.4* 0.8  PROT 7.1 4.9* 4.8*  ALBUMIN 2.7* 1.9* 2.0*   Recent Labs  Lab 07/12/17 0900  LIPASE 26   Recent Labs  Lab 07/12/17 1023  AMMONIA 36*   Coagulation Profile: Recent Labs  Lab 07/12/17 1022  INR 1.00   Cardiac Enzymes: No results for input(s): CKTOTAL, CKMB, CKMBINDEX, TROPONINI in the last 168 hours. BNP (last 3 results) No results for input(s): PROBNP in the last 8760 hours. HbA1C: No results for input(s): HGBA1C in the last 72 hours. CBG: No results for input(s): GLUCAP in the last 168 hours. Lipid Profile: No results for input(s): CHOL, HDL, LDLCALC, TRIG, CHOLHDL, LDLDIRECT in the last 72 hours. Thyroid Function Tests: No results for input(s): TSH, T4TOTAL, FREET4, T3FREE, THYROIDAB in the last 72 hours. Anemia Panel: No results for input(s): VITAMINB12, FOLATE, FERRITIN, TIBC, IRON, RETICCTPCT in the last 72 hours. Sepsis Labs: Recent Labs  Lab 07/12/17 0908 07/12/17 1146 07/12/17 1905 07/12/17 2143  LATICACIDVEN 5.44* 3.47* 2.1* 1.9    Recent Results (from the past 240 hour(s))  Urine culture     Status: Abnormal   Collection Time: 07/12/17  4:54 PM  Result Value Ref Range Status   Specimen Description URINE, RANDOM  Final   Special Requests   Final    NONE Performed at [facility], [place] [address] [place], [place] [address]    Culture >=100,000 COLONIES/mL ESCHERICHIA COLI (A)  Final   Report Status 07/15/2017 FINAL  Final   Organism ID, Bacteria ESCHERICHIA COLI (A)  Final      Susceptibility   Escherichia coli - MIC*    AMPICILLIN <=2 SENSITIVE Sensitive     CEFAZOLIN <=4 SENSITIVE Sensitive     CEFTRIAXONE <=1 SENSITIVE Sensitive     CIPROFLOXACIN >=4 RESISTANT Resistant     GENTAMICIN <=1  SENSITIVE Sensitive     IMIPENEM <=0.25 SENSITIVE Sensitive     NITROFURANTOIN <=16 SENSITIVE Sensitive     TRIMETH/SULFA <=20 SENSITIVE Sensitive     AMPICILLIN/SULBACTAM <=2 SENSITIVE Sensitive     PIP/TAZO <=4 SENSITIVE Sensitive     Extended ESBL NEGATIVE Sensitive     * >=100,000 COLONIES/mL ESCHERICHIA COLI         Radiology Studies: No results found.      Scheduled Meds: . carvedilol  12.5 mg Oral BID WC  . enoxaparin (LOVENOX) injection  40 mg Subcutaneous Q24H  . feeding supplement  1 Container Oral TID BM  . metoCLOPramide (REGLAN) injection  10 mg Intravenous Q6H  . pantoprazole sodium  40 mg Oral BID  . sodium chloride flush  3 mL Intravenous Q12H   Continuous Infusions: . cefTRIAXone (ROCEPHIN)  IV Stopped (07/14/17 1711)     LOS: 2 days     Cordelia Poche, MD [facility] 07/15/2017, 11:46  AM Pager: [phone]  If 7PM-7AM, please contact night-coverage [email or web address] Password [facility] 07/15/2017, 11:46 AM
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
## 2017-07-16 LAB — COMPREHENSIVE METABOLIC PANEL
ALT: 30 U/L (ref 14–54)
AST: 69 U/L — ABNORMAL HIGH (ref 15–41)
Albumin: 2.1 g/dL — ABNORMAL LOW (ref 3.5–5.0)
Alkaline Phosphatase: 124 U/L (ref 38–126)
Anion gap: 10 (ref 5–15)
BUN: <5 mg/dL — ABNORMAL LOW (ref 6–20)
CO2: 23 mmol/L (ref 22–32)
Calcium: 7.8 mg/dL — ABNORMAL LOW (ref 8.9–10.3)
Chloride: 103 mmol/L (ref 101–111)
Creatinine, Ser: 0.69 mg/dL (ref 0.44–1.00)
GFR calc Af Amer: >60 mL/min (ref >60)
GFR calc non Af Amer: >60 mL/min (ref >60)
Glucose, Bld: 87 mg/dL (ref 65–99)
Potassium: 3.7 mmol/L (ref 3.5–5.1)
Sodium: 136 mmol/L (ref 135–145)
Total Bilirubin: 0.6 mg/dL (ref 0.3–1.2)
Total Protein: 5.1 g/dL — ABNORMAL LOW (ref 6.5–8.1)

## 2017-07-16 MED ORDER — CEPHALEXIN 500 MG PO CAPS: 500.0000 mg | ORAL_CAPSULE | Freq: Two times a day (BID) | ORAL | 0 refills | Status: AC

## 2017-07-16 MED ORDER — PANTOPRAZOLE SODIUM 40 MG PO TBEC: 40.0000 mg | DELAYED_RELEASE_TABLET | Freq: Every day | ORAL | 0 refills | Status: DC

## 2017-07-16 MED ORDER — DICLOFENAC SODIUM 50 MG PO TBEC: 50.0000 mg | DELAYED_RELEASE_TABLET | Freq: Two times a day (BID) | ORAL | 0 refills | Status: DC | PRN

## 2017-07-16 MED ORDER — ONDANSETRON 4 MG PO TBDP: 4.0000 mg | ORAL_TABLET | Freq: Three times a day (TID) | ORAL | 0 refills | Status: DC | PRN

## 2017-07-16 NOTE — Discharge Summary (Signed)
Physician Discharge Summary  Jamie [name] WYO:[id number] DOB: [birth year] DOA: 07/12/2017  PCP: Helen Hashimoto., MD  Admit date: 07/12/2017 Discharge date: 07/16/2017  Admitted From: Home Disposition: Home  Recommendations for Outpatient Follow-up:  1. Follow up with PCP in 1 week 2. Follow up with gastroenterology 3. Please obtain BMP/CBC in one week 4. Please follow up on the following pending results: None  Home Health: Outpatient PT (Referred by orthopedic surgeon as an outpatient) Equipment/Devices: None  Discharge Condition: Stable CODE STATUS: Full code Diet recommendation: Heart healthy   Brief/Interim Summary:  Admission HPI written by Karmen Bongo, MD   Chief Complaint:  Abdominal pain  HPI: Jamie [name] is a 52 y.o. female with medical history significant of Hep C, bipolar, cirrhosis, and h/o opiate dependence on suboxone until a few months ago presenting with abdominal pain and n/v.  She got sick a couple of weeks ago - feeling not real good, nauseated.  It accelerated into diarrhea.  Today is day 5 without eating.  She has been trying to drink water but she is throwing it up.  She feels so thirsty.  She has mid-lower abdominal pain for about 5 days, worse since yesterday.  She fell and hurt her rib.  She was having bad diarrhea and got up overnight to go to the bathroom and she tripped over her bed.  She landed on her right ribcage.  Se has a h/o many UTIs and is having back pain and is having to force the urine to come out.  It feels like she has a UTI.  No fever.  She gets cold and has chills, but subjective - does not own a thermometer.    She was previously on suboxone but has not been taking it for several months - she has been sick and going to the doctor and has not needed to take it.  She has had chronic vomiting for several months.  Dr. Paulita Fujita was planning to do EGD and c-scope but she was too sick to drink the bowel prep.     ED Course:  1 week  of abd pain, n/v.  Admission for intractable vomiting.  Zofran, IVF, unable to tolerate PO.  Thought to be viral gastroenteritis.  Does not appear to be surgical or intraabdominal infection.      Hospital course:  UTI secondary to E. Coli Urine culture sensitivities showing resistance only to fluoroquinolones. Symptoms improved. Transitioned to Keflex with prescription on discharge.  Dehydration Secondary to nausea, vomiting and diarrhea. Diarrhea is chronic. Nausea and vomiting improved with treatment of UTI and Zofran.  Nausea/vomiting Diarrhea Patient follows with Dr. Paulita Fujita as an outpatient. She was scheduled for EGD and colonoscopy as an outpatient but was unable to get this done secondary to worsening symptoms. Antibiotics may be worsening diarrhea vs patient recently just being on clear liquid diet. CT scan shows chronic inflammation of the colon. Outpatient GI follow-up  Cirrhosis Chronic. Patient follows with Dr. Paulita Fujita. AST elevated on admission and is improving. Bilirubin also improved.  Opiate dependence History. No longer on suboxone  Pancytopenia Thought secondary to cirrhosis but possibly dilutional. Improved  Discharge Diagnoses:  Principal Problem:   Acute lower UTI Active Problems:   Hepatic cirrhosis (HCC)   Opioid use disorder (HCC)   Dehydration   Refractory nausea and vomiting    Discharge Instructions  Discharge Instructions    Call MD for:  persistant nausea and vomiting   Complete by:  As directed  Diet - low sodium heart healthy   Complete by:  As directed    Increase activity slowly   Complete by:  As directed      Allergies as of 07/16/2017   No Known Allergies     Medication List    STOP taking these medications   omeprazole 20 MG capsule Commonly known as:  PRILOSEC     TAKE these medications   acetaminophen 500 MG tablet Commonly known as:  TYLENOL Take 500-1,000 mg by mouth every 6 (six) hours as needed for headache  (pain).   carvedilol 12.5 MG tablet Commonly known as:  COREG Take 12.5 mg by mouth 2 (two) times daily with a meal.   cephALEXin 500 MG capsule Commonly known as:  KEFLEX Take 1 capsule (500 mg total) by mouth 2 (two) times daily for 3 days. What changed:  when to take this   diclofenac 50 MG EC tablet Commonly known as:  VOLTAREN Take 1 tablet (50 mg total) by mouth 2 (two) times daily as needed.   ondansetron 4 MG disintegrating tablet Commonly known as:  ZOFRAN ODT Take 1 tablet (4 mg total) by mouth every 8 (eight) hours as needed for nausea or vomiting.   pantoprazole 40 MG tablet Commonly known as:  PROTONIX Take 1 tablet (40 mg total) by mouth daily.      Follow-up Information    Arta Silence, MD.   Specialty:  Gastroenterology Why:  As soon as possible for follow up Contact information: [address] [place] [address] [phone]        Helen Hashimoto., MD.   Specialty:  Internal Medicine Why:  As soon as possible for follow up Contact information: [place] [id number] [place].   Specialty:  Emergency Medicine Why:  As needed, If symptoms worsen Contact information: [address] [id number] [place] [phone]         No Known Allergies  Consultations:  None   Procedures/Studies: Dg Ribs Unilateral W/chest Right  Result Date: 07/12/2017 CLINICAL DATA:  [place] with right-sided chest pain. EXAM: RIGHT RIBS AND CHEST - 3+ VIEW COMPARISON:  05/27/2017 FINDINGS: Artifact overlies the chest. Heart size is normal. Mediastinal shadows are normal. No pneumothorax or hemothorax. Bilateral breast implants. Right rib films do not show any fracture allowing for the overlying artifact. IMPRESSION: No active disease. No right rib fracture seen. No evidence any other chest disease. Electronically  Signed   By: Nelson Chimes M.D.   On: 07/12/2017 11:06   Ct Abdomen Pelvis W Contrast  Result Date: 07/12/2017 CLINICAL DATA:  Mid and upper abdominal pain with nausea for 3 weeks. Symptoms have worsened over the past 3 days. EXAM: CT ABDOMEN AND PELVIS WITH CONTRAST TECHNIQUE: Multidetector CT imaging of the abdomen and pelvis was performed using the standard protocol following bolus administration of intravenous contrast. CONTRAST:  100 cc ISOVUE-300 IOPAMIDOL (ISOVUE-300) INJECTION 61% COMPARISON:  CT abdomen and pelvis 05/27/2017. FINDINGS: Lower chest: Bilateral breast implants noted. Mild dependent atelectasis is seen. No pleural or pericardial effusion. Hepatobiliary: There is diffuse fatty infiltration of the liver. No focal liver lesion. The patient is status post cholecystectomy. Biliary tree is unremarkable. Pancreas: Unremarkable. No pancreatic ductal dilatation or surrounding inflammatory changes. Spleen: Normal in size without focal abnormality. Adrenals/Urinary Tract: Adrenal glands are unremarkable. Kidneys are normal, without renal calculi, focal  lesion, or hydronephrosis. Bladder is unremarkable. Stomach/Bowel: Status post appendectomy. The colon is largely decompressed. Fatty infiltration of the walls of the ascending and transverse colon is most consistent with chronic/remote inflammatory change and stable in appearance. Small hiatal hernia is noted. The stomach is otherwise unremarkable. Small bowel appears normal. Vascular/Lymphatic: Aortic atherosclerosis. No enlarged abdominal or pelvic lymph nodes. Reproductive: Status post hysterectomy. No adnexal masses. Other: No ascites. Musculoskeletal: Mild, remote anterior, superior endplate compression fracture of L4 is unchanged. IMPRESSION: No acute abnormality abdomen or pelvis. Diffuse fatty infiltration of the liver. Small hiatal hernia. Atherosclerosis. Fatty infiltration of the walls of the ascending and transverse colon is consistent with  chronic/remote inflammatory process. Electronically Signed   By: Inge Rise M.D.   On: 07/12/2017 12:54     Subjective: Feels better. No nausea or diarrhea.  Discharge Exam: Vitals:   07/16/17 0429 07/16/17 1406  BP: (!) 114/57 138/74  Pulse: 78 84  Resp: 18 18  Temp: 98.1 F (36.7 C) 98 F (36.7 C)  SpO2: 97% 99%   Vitals:   07/15/17 1720 07/15/17 2137 07/16/17 0429 07/16/17 1406  BP: 128/86 115/65 (!) 114/57 138/74  Pulse: 85 85 78 84  Resp:  18 18 18   Temp:  98.2 F (36.8 C) 98.1 F (36.7 C) 98 F (36.7 C)  TempSrc:  Oral Oral Oral  SpO2:  100% 97% 99%  Weight:      Height:        General: Pt is alert, awake, not in acute distress    The results of significant diagnostics from this hospitalization (including imaging, microbiology, ancillary and laboratory) are listed below for reference.     Microbiology: Recent Results (from the past 240 hour(s))  Urine culture     Status: Abnormal   Collection Time: 07/12/17  4:54 PM  Result Value Ref Range Status   Specimen Description URINE, RANDOM  Final   Special Requests   Final    NONE Performed at [facility], [address] [place], [place] [address]    Culture >=100,000 COLONIES/mL ESCHERICHIA COLI (A)  Final   Report Status 07/15/2017 FINAL  Final   Organism ID, Bacteria ESCHERICHIA COLI (A)  Final      Susceptibility   Escherichia coli - MIC*    AMPICILLIN <=2 SENSITIVE Sensitive     CEFAZOLIN <=4 SENSITIVE Sensitive     CEFTRIAXONE <=1 SENSITIVE Sensitive     CIPROFLOXACIN >=4 RESISTANT Resistant     GENTAMICIN <=1 SENSITIVE Sensitive     IMIPENEM <=0.25 SENSITIVE Sensitive     NITROFURANTOIN <=16 SENSITIVE Sensitive     TRIMETH/SULFA <=20 SENSITIVE Sensitive     AMPICILLIN/SULBACTAM <=2 SENSITIVE Sensitive     PIP/TAZO <=4 SENSITIVE Sensitive     Extended ESBL NEGATIVE Sensitive     * >=100,000 COLONIES/mL ESCHERICHIA COLI     Labs: BNP (last 3 results) No results for input(s): BNP  in the last 8760 hours. Basic Metabolic Panel: Recent Labs  Lab 07/12/17 0900 07/13/17 0740 07/14/17 0621 07/15/17 0500 07/16/17 0556  NA 138 133* 137 137 136  K 3.6 3.6 3.2* 3.3* 3.7  CL 99* 101 105 107 103  CO2 23 22 23 22 23   GLUCOSE 109* 84 88 86 87  BUN <5* <5* <5* <5* <5*  CREATININE 0.84 0.65 0.66 0.70 0.69  CALCIUM 8.8* 7.7* 7.5* 7.8* 7.8*   Liver Function Tests: Recent Labs  Lab 07/12/17 0900 07/13/17 0740 07/15/17 0500 07/16/17 0556  AST 199* 121* 71* 69*  ALT 71* 45 32 30  ALKPHOS 195* 145* 122 124  BILITOT 1.8* 1.4* 0.8 0.6  PROT 7.1 4.9* 4.8* 5.1*  ALBUMIN 2.7* 1.9* 2.0* 2.1*   Recent Labs  Lab 07/12/17 0900  LIPASE 26   Recent Labs  Lab 07/12/17 1023  AMMONIA 36*   CBC: Recent Labs  Lab 07/12/17 0900 07/13/17 0930 07/14/17 0621 07/15/17 0500  WBC 8.2 3.5* 2.9* 3.2*  NEUTROABS  --   --   --  1.4*  HGB 14.8 10.3* 10.4* 10.4*  HCT 43.3 30.5* 30.2* 31.8*  MCV 98.4 98.1 97.1 97.2  PLT 278 143* 147* 168   Cardiac Enzymes: No results for input(s): CKTOTAL, CKMB, CKMBINDEX, TROPONINI in the last 168 hours. BNP: Invalid input(s): POCBNP CBG: No results for input(s): GLUCAP in the last 168 hours. D-Dimer No results for input(s): DDIMER in the last 72 hours. Hgb A1c No results for input(s): HGBA1C in the last 72 hours. Lipid Profile No results for input(s): CHOL, HDL, LDLCALC, TRIG, CHOLHDL, LDLDIRECT in the last 72 hours. Thyroid function studies No results for input(s): TSH, T4TOTAL, T3FREE, THYROIDAB in the last 72 hours.  Invalid input(s): FREET3 Anemia work up No results for input(s): VITAMINB12, FOLATE, FERRITIN, TIBC, IRON, RETICCTPCT in the last 72 hours. Urinalysis    Component Value Date/Time   COLORURINE AMBER (A) 07/12/2017 0907   APPEARANCEUR CLOUDY (A) 07/12/2017 0907   LABSPEC 1.008 07/12/2017 0907   PHURINE 6.0 07/12/2017 0907   GLUCOSEU NEGATIVE 07/12/2017 0907   HGBUR SMALL (A) 07/12/2017 0907   BILIRUBINUR  NEGATIVE 07/12/2017 0907   KETONESUR NEGATIVE 07/12/2017 0907   PROTEINUR NEGATIVE 07/12/2017 0907   UROBILINOGEN 0.2 06/23/2011 0640   NITRITE NEGATIVE 07/12/2017 0907   LEUKOCYTESUR LARGE (A) 07/12/2017 0907   Sepsis Labs Invalid input(s): PROCALCITONIN,  WBC,  LACTICIDVEN Microbiology Recent Results (from the past 240 hour(s))  Urine culture     Status: Abnormal   Collection Time: 07/12/17  4:54 PM  Result Value Ref Range Status   Specimen Description URINE, RANDOM  Final   Special Requests   Final    NONE Performed at [facility], [place] [address] [place], [place] [address]    Culture >=100,000 COLONIES/mL ESCHERICHIA COLI (A)  Final   Report Status 07/15/2017 FINAL  Final   Organism ID, Bacteria ESCHERICHIA COLI (A)  Final      Susceptibility   Escherichia coli - MIC*    AMPICILLIN <=2 SENSITIVE Sensitive     CEFAZOLIN <=4 SENSITIVE Sensitive     CEFTRIAXONE <=1 SENSITIVE Sensitive     CIPROFLOXACIN >=4 RESISTANT Resistant     GENTAMICIN <=1 SENSITIVE Sensitive     IMIPENEM <=0.25 SENSITIVE Sensitive     NITROFURANTOIN <=16 SENSITIVE Sensitive     TRIMETH/SULFA <=20 SENSITIVE Sensitive     AMPICILLIN/SULBACTAM <=2 SENSITIVE Sensitive     PIP/TAZO <=4 SENSITIVE Sensitive     Extended ESBL NEGATIVE Sensitive     * >=100,000 COLONIES/mL ESCHERICHIA COLI     SIGNED:   Cordelia Poche, MD [facility] 07/16/2017, 2:21 PM Pager [phone]  If 7PM-7AM, please contact night-coverage [email or web address] Password [email or web address]

## 2017-07-16 NOTE — Progress Notes (Signed)
Patient discharge teaching given, including activity, diet, and follow-up appoints. Patient verbalized understanding of all discharge instructions. IV access was d/c'd. Vitals are stable. Skin is intact except as charted in most recent assessments. Pt to be escorted out by NT, to be driven home by family.  Waiting on printed prescription.

## 2017-08-17 DIAGNOSIS — S32000A Wedge compression fracture of unspecified lumbar vertebra, initial encounter for closed fracture: Secondary | ICD-10-CM | POA: Diagnosis not present

## 2017-08-30 DIAGNOSIS — Z6824 Body mass index (BMI) 24.0-24.9, adult: Secondary | ICD-10-CM | POA: Diagnosis not present

## 2017-09-03 DIAGNOSIS — L03114 Cellulitis of left upper limb: Secondary | ICD-10-CM | POA: Diagnosis not present

## 2017-09-03 DIAGNOSIS — L02414 Cutaneous abscess of left upper limb: Secondary | ICD-10-CM | POA: Diagnosis not present

## 2017-09-06 DIAGNOSIS — Z6824 Body mass index (BMI) 24.0-24.9, adult: Secondary | ICD-10-CM | POA: Diagnosis not present

## 2017-09-07 DIAGNOSIS — Z79899 Other long term (current) drug therapy: Secondary | ICD-10-CM | POA: Diagnosis not present

## 2017-09-07 DIAGNOSIS — K7460 Unspecified cirrhosis of liver: Secondary | ICD-10-CM | POA: Diagnosis not present

## 2017-09-07 DIAGNOSIS — Z6821 Body mass index (BMI) 21.0-21.9, adult: Secondary | ICD-10-CM | POA: Diagnosis not present

## 2017-09-07 DIAGNOSIS — K219 Gastro-esophageal reflux disease without esophagitis: Secondary | ICD-10-CM | POA: Diagnosis not present

## 2017-09-07 DIAGNOSIS — I10 Essential (primary) hypertension: Secondary | ICD-10-CM | POA: Diagnosis not present

## 2017-09-09 DIAGNOSIS — M545 Low back pain: Secondary | ICD-10-CM | POA: Diagnosis not present

## 2017-09-09 DIAGNOSIS — S32000A Wedge compression fracture of unspecified lumbar vertebra, initial encounter for closed fracture: Secondary | ICD-10-CM | POA: Diagnosis not present

## 2017-09-11 DIAGNOSIS — R1084 Generalized abdominal pain: Secondary | ICD-10-CM | POA: Diagnosis not present

## 2017-09-11 DIAGNOSIS — R109 Unspecified abdominal pain: Secondary | ICD-10-CM | POA: Diagnosis not present

## 2017-09-11 DIAGNOSIS — K529 Noninfective gastroenteritis and colitis, unspecified: Secondary | ICD-10-CM | POA: Diagnosis not present

## 2017-09-11 DIAGNOSIS — N3000 Acute cystitis without hematuria: Secondary | ICD-10-CM | POA: Diagnosis not present

## 2017-09-13 DIAGNOSIS — S32000A Wedge compression fracture of unspecified lumbar vertebra, initial encounter for closed fracture: Secondary | ICD-10-CM | POA: Diagnosis not present

## 2017-09-13 DIAGNOSIS — Z6821 Body mass index (BMI) 21.0-21.9, adult: Secondary | ICD-10-CM | POA: Diagnosis not present

## 2017-09-14 ENCOUNTER — Observation Stay (HOSPITAL_COMMUNITY)
Admission: EM | Admit: 2017-09-14 | Discharge: 2017-09-17 | Disposition: A | Discharge status: Home / Self Care | Payer: Medicare Other | Attending: Internal Medicine | Admitting: Internal Medicine

## 2017-09-14 ENCOUNTER — Emergency Department (HOSPITAL_COMMUNITY): Payer: Medicare Other

## 2017-09-14 ENCOUNTER — Encounter (HOSPITAL_COMMUNITY): Payer: Self-pay | Admitting: *Deleted

## 2017-09-14 DIAGNOSIS — B1920 Unspecified viral hepatitis C without hepatic coma: Secondary | ICD-10-CM | POA: Insufficient documentation

## 2017-09-14 DIAGNOSIS — R109 Unspecified abdominal pain: Secondary | ICD-10-CM | POA: Diagnosis not present

## 2017-09-14 DIAGNOSIS — F319 Bipolar disorder, unspecified: Secondary | ICD-10-CM | POA: Diagnosis not present

## 2017-09-14 DIAGNOSIS — N390 Urinary tract infection, site not specified: Secondary | ICD-10-CM | POA: Insufficient documentation

## 2017-09-14 DIAGNOSIS — Z9049 Acquired absence of other specified parts of digestive tract: Secondary | ICD-10-CM | POA: Insufficient documentation

## 2017-09-14 DIAGNOSIS — Z90710 Acquired absence of both cervix and uterus: Secondary | ICD-10-CM | POA: Diagnosis not present

## 2017-09-14 DIAGNOSIS — F1120 Opioid dependence, uncomplicated: Secondary | ICD-10-CM | POA: Diagnosis not present

## 2017-09-14 DIAGNOSIS — I10 Essential (primary) hypertension: Secondary | ICD-10-CM | POA: Diagnosis not present

## 2017-09-14 DIAGNOSIS — R12 Heartburn: Secondary | ICD-10-CM | POA: Diagnosis not present

## 2017-09-14 DIAGNOSIS — E860 Dehydration: Secondary | ICD-10-CM | POA: Diagnosis not present

## 2017-09-14 DIAGNOSIS — R079 Chest pain, unspecified: Secondary | ICD-10-CM | POA: Diagnosis not present

## 2017-09-14 DIAGNOSIS — K219 Gastro-esophageal reflux disease without esophagitis: Secondary | ICD-10-CM | POA: Insufficient documentation

## 2017-09-14 DIAGNOSIS — Z811 Family history of alcohol abuse and dependence: Secondary | ICD-10-CM | POA: Diagnosis not present

## 2017-09-14 DIAGNOSIS — K7460 Unspecified cirrhosis of liver: Secondary | ICD-10-CM | POA: Diagnosis not present

## 2017-09-14 DIAGNOSIS — F419 Anxiety disorder, unspecified: Secondary | ICD-10-CM | POA: Insufficient documentation

## 2017-09-14 DIAGNOSIS — J209 Acute bronchitis, unspecified: Secondary | ICD-10-CM | POA: Insufficient documentation

## 2017-09-14 DIAGNOSIS — Z8489 Family history of other specified conditions: Secondary | ICD-10-CM | POA: Insufficient documentation

## 2017-09-14 DIAGNOSIS — R600 Localized edema: Secondary | ICD-10-CM | POA: Diagnosis not present

## 2017-09-14 DIAGNOSIS — B9620 Unspecified Escherichia coli [E. coli] as the cause of diseases classified elsewhere: Secondary | ICD-10-CM | POA: Diagnosis not present

## 2017-09-14 DIAGNOSIS — Z79899 Other long term (current) drug therapy: Secondary | ICD-10-CM | POA: Diagnosis not present

## 2017-09-14 DIAGNOSIS — Z1623 Resistance to quinolones and fluoroquinolones: Secondary | ICD-10-CM | POA: Diagnosis not present

## 2017-09-14 DIAGNOSIS — F1990 Other psychoactive substance use, unspecified, uncomplicated: Secondary | ICD-10-CM | POA: Diagnosis not present

## 2017-09-14 DIAGNOSIS — R0789 Other chest pain: Secondary | ICD-10-CM | POA: Insufficient documentation

## 2017-09-14 DIAGNOSIS — R112 Nausea with vomiting, unspecified: Secondary | ICD-10-CM | POA: Diagnosis not present

## 2017-09-14 DIAGNOSIS — G8929 Other chronic pain: Secondary | ICD-10-CM | POA: Insufficient documentation

## 2017-09-14 DIAGNOSIS — K7030 Alcoholic cirrhosis of liver without ascites: Secondary | ICD-10-CM | POA: Diagnosis not present

## 2017-09-14 DIAGNOSIS — R110 Nausea: Secondary | ICD-10-CM | POA: Diagnosis not present

## 2017-09-14 DIAGNOSIS — R1084 Generalized abdominal pain: Secondary | ICD-10-CM

## 2017-09-14 DIAGNOSIS — R197 Diarrhea, unspecified: Secondary | ICD-10-CM | POA: Diagnosis not present

## 2017-09-14 DIAGNOSIS — K529 Noninfective gastroenteritis and colitis, unspecified: Secondary | ICD-10-CM | POA: Diagnosis not present

## 2017-09-14 DIAGNOSIS — F1199 Opioid use, unspecified with unspecified opioid-induced disorder: Secondary | ICD-10-CM | POA: Diagnosis present

## 2017-09-14 DIAGNOSIS — F1190 Opioid use, unspecified, uncomplicated: Secondary | ICD-10-CM | POA: Diagnosis present

## 2017-09-14 LAB — RAPID URINE DRUG SCREEN, HOSP PERFORMED
Amphetamines: NOT DETECTED
Barbiturates: NOT DETECTED
Benzodiazepines: POSITIVE — AB
Cocaine: NOT DETECTED
Opiates: POSITIVE — AB
Tetrahydrocannabinol: NOT DETECTED

## 2017-09-14 LAB — COMPREHENSIVE METABOLIC PANEL
ALT: 36 U/L (ref 14–54)
AST: 80 U/L — ABNORMAL HIGH (ref 15–41)
Albumin: 2.5 g/dL — ABNORMAL LOW (ref 3.5–5.0)
Alkaline Phosphatase: 178 U/L — ABNORMAL HIGH (ref 38–126)
Anion gap: 15 (ref 5–15)
BUN: <5 mg/dL — ABNORMAL LOW (ref 6–20)
CO2: 17 mmol/L — ABNORMAL LOW (ref 22–32)
Calcium: 8.1 mg/dL — ABNORMAL LOW (ref 8.9–10.3)
Chloride: 106 mmol/L (ref 101–111)
Creatinine, Ser: 0.94 mg/dL (ref 0.44–1.00)
GFR calc Af Amer: >60 mL/min (ref >60)
GFR calc non Af Amer: >60 mL/min (ref >60)
Glucose, Bld: 68 mg/dL (ref 65–99)
Potassium: 3.4 mmol/L — ABNORMAL LOW (ref 3.5–5.1)
Sodium: 138 mmol/L (ref 135–145)
Total Bilirubin: 0.9 mg/dL (ref 0.3–1.2)
Total Protein: 7.1 g/dL (ref 6.5–8.1)

## 2017-09-14 LAB — URINALYSIS, ROUTINE W REFLEX MICROSCOPIC
Bacteria, UA: NONE SEEN
Bilirubin Urine: NEGATIVE
Glucose, UA: NEGATIVE mg/dL
Ketones, ur: NEGATIVE mg/dL
Leukocytes, UA: NEGATIVE
Nitrite: NEGATIVE
Protein, ur: NEGATIVE mg/dL
Specific Gravity, Urine: 1.005 (ref 1.005–1.030)
pH: 7 (ref 5.0–8.0)

## 2017-09-14 LAB — CBC
HCT: 36.7 % (ref 36.0–46.0)
Hemoglobin: 12.2 g/dL (ref 12.0–15.0)
MCH: 31.1 pg (ref 26.0–34.0)
MCHC: 33.2 g/dL (ref 30.0–36.0)
MCV: 93.6 fL (ref 78.0–100.0)
Platelets: 383 10*3/uL (ref 150–400)
RBC: 3.92 MIL/uL (ref 3.87–5.11)
RDW: 15 % (ref 11.5–15.5)
WBC: 8.1 10*3/uL (ref 4.0–10.5)

## 2017-09-14 LAB — I-STAT TROPONIN, ED: Troponin i, poc: 0 ng/mL (ref 0.00–0.08)

## 2017-09-14 LAB — TROPONIN I: Troponin I: <0.03 ng/mL (ref <0.03)

## 2017-09-14 LAB — ETHANOL: Alcohol, Ethyl (B): 31 mg/dL — ABNORMAL HIGH (ref <10)

## 2017-09-14 LAB — LIPASE, BLOOD: Lipase: 27 U/L (ref 11–51)

## 2017-09-14 MED ORDER — PIPERACILLIN-TAZOBACTAM 3.375 G IVPB 30 MIN: 3.3750 g | Freq: Three times a day (TID) | INTRAVENOUS | Status: DC

## 2017-09-14 MED ORDER — KETOROLAC TROMETHAMINE 30 MG/ML IJ SOLN
30.0000 mg | Freq: Four times a day (QID) | INTRAMUSCULAR | Status: DC | PRN
  Administered 2017-09-15: 30 mg via INTRAVENOUS
  Filled 2017-09-14: qty 1

## 2017-09-14 MED ORDER — PIPERACILLIN-TAZOBACTAM 3.375 G IVPB
3.3750 g | Freq: Three times a day (TID) | INTRAVENOUS | Status: DC
  Administered 2017-09-15 – 2017-09-17 (×8): 3.375 g via INTRAVENOUS
  Filled 2017-09-14 (×7): qty 50

## 2017-09-14 MED ORDER — DIPHENHYDRAMINE HCL 50 MG/ML IJ SOLN
25.0000 mg | INTRAMUSCULAR | Status: AC
  Administered 2017-09-14: 23:00:00 via INTRAVENOUS
  Filled 2017-09-14: qty 1

## 2017-09-14 MED ORDER — DIPHENHYDRAMINE HCL 25 MG PO CAPS
25.0000 mg | ORAL_CAPSULE | Freq: Four times a day (QID) | ORAL | Status: DC | PRN
Start: 2017-09-14 — End: 2017-09-17
  Administered 2017-09-15 – 2017-09-16 (×3): 25 mg via ORAL
  Filled 2017-09-14 (×3): qty 1

## 2017-09-14 MED ORDER — ONDANSETRON HCL 4 MG/2ML IJ SOLN
4.0000 mg | Freq: Once | INTRAMUSCULAR | Status: AC
  Administered 2017-09-14: 4 mg via INTRAVENOUS
  Filled 2017-09-14: qty 2

## 2017-09-14 MED ORDER — PIPERACILLIN-TAZOBACTAM 3.375 G IVPB 30 MIN: 3.3750 g | Freq: Once | INTRAVENOUS | Status: DC

## 2017-09-14 MED ORDER — ONDANSETRON HCL 4 MG/2ML IJ SOLN
4.0000 mg | Freq: Four times a day (QID) | INTRAMUSCULAR | Status: DC | PRN
  Administered 2017-09-16 – 2017-09-17 (×3): 4 mg via INTRAVENOUS
  Filled 2017-09-14 (×4): qty 2

## 2017-09-14 MED ORDER — ONDANSETRON HCL 4 MG PO TABS: 4.0000 mg | ORAL_TABLET | Freq: Four times a day (QID) | ORAL | Status: DC | PRN

## 2017-09-14 MED ORDER — SODIUM CHLORIDE 0.9 % IV BOLUS
1000.0000 mL | Freq: Once | INTRAVENOUS | Status: AC
  Administered 2017-09-14: 1000 mL via INTRAVENOUS

## 2017-09-14 MED ORDER — MORPHINE SULFATE (PF) 4 MG/ML IV SOLN
4.0000 mg | Freq: Once | INTRAVENOUS | Status: AC
  Administered 2017-09-14: 4 mg via INTRAVENOUS
  Filled 2017-09-14: qty 1

## 2017-09-14 MED ORDER — MORPHINE SULFATE (PF) 2 MG/ML IV SOLN
2.0000 mg | INTRAVENOUS | Status: DC | PRN
  Administered 2017-09-14 – 2017-09-16 (×11): 2 mg via INTRAVENOUS
  Filled 2017-09-14 (×12): qty 1

## 2017-09-14 MED ORDER — SODIUM CHLORIDE 0.9% FLUSH: 3.0000 mL | Freq: Two times a day (BID) | INTRAVENOUS | Status: DC

## 2017-09-14 MED ORDER — SODIUM CHLORIDE 0.9 % IV SOLN
INTRAVENOUS | Status: DC
  Administered 2017-09-14: 100 mL/h via INTRAVENOUS
  Administered 2017-09-16 – 2017-09-17 (×3): via INTRAVENOUS

## 2017-09-14 MED ORDER — ALBUTEROL SULFATE (2.5 MG/3ML) 0.083% IN NEBU: 2.5000 mg | INHALATION_SOLUTION | Freq: Four times a day (QID) | RESPIRATORY_TRACT | Status: DC | PRN

## 2017-09-14 MED ORDER — ENOXAPARIN SODIUM 40 MG/0.4ML ~~LOC~~ SOLN
40.0000 mg | SUBCUTANEOUS | Status: DC
  Administered 2017-09-14: 40 mg via SUBCUTANEOUS
  Filled 2017-09-14 (×2): qty 0.4

## 2017-09-14 MED ORDER — BOOST / RESOURCE BREEZE PO LIQD CUSTOM
1.0000 | Freq: Three times a day (TID) | ORAL | Status: DC
  Administered 2017-09-15 – 2017-09-16 (×2): 1 via ORAL

## 2017-09-14 NOTE — ED Notes (Signed)
This RN is only able to obtain 1 set of cultures.
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
## 2017-09-14 NOTE — ED Notes (Signed)
Second set of bld cultures collected from left hand.
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
## 2017-09-14 NOTE — ED Triage Notes (Signed)
Pt was sent here by her pcp for a possible admission; she c/o abdominal pain, nausea/vomiting, also was told she has e.colli in her blood.
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
## 2017-09-15 ENCOUNTER — Other Ambulatory Visit: Payer: Self-pay

## 2017-09-15 DIAGNOSIS — K529 Noninfective gastroenteritis and colitis, unspecified: Secondary | ICD-10-CM | POA: Diagnosis not present

## 2017-09-15 DIAGNOSIS — R0789 Other chest pain: Secondary | ICD-10-CM | POA: Diagnosis present

## 2017-09-15 DIAGNOSIS — K7030 Alcoholic cirrhosis of liver without ascites: Secondary | ICD-10-CM | POA: Diagnosis not present

## 2017-09-15 DIAGNOSIS — R112 Nausea with vomiting, unspecified: Secondary | ICD-10-CM | POA: Diagnosis not present

## 2017-09-15 DIAGNOSIS — R109 Unspecified abdominal pain: Secondary | ICD-10-CM | POA: Diagnosis not present

## 2017-09-15 LAB — CBC
HCT: 31.3 % — ABNORMAL LOW (ref 36.0–46.0)
Hemoglobin: 10.3 g/dL — ABNORMAL LOW (ref 12.0–15.0)
MCH: 30.7 pg (ref 26.0–34.0)
MCHC: 32.9 g/dL (ref 30.0–36.0)
MCV: 93.4 fL (ref 78.0–100.0)
Platelets: 273 10*3/uL (ref 150–400)
RBC: 3.35 MIL/uL — ABNORMAL LOW (ref 3.87–5.11)
RDW: 15.3 % (ref 11.5–15.5)
WBC: 5.3 10*3/uL (ref 4.0–10.5)

## 2017-09-15 LAB — BASIC METABOLIC PANEL
Anion gap: 9 (ref 5–15)
BUN: <5 mg/dL — ABNORMAL LOW (ref 6–20)
CO2: 20 mmol/L — ABNORMAL LOW (ref 22–32)
Calcium: 7.5 mg/dL — ABNORMAL LOW (ref 8.9–10.3)
Chloride: 108 mmol/L (ref 101–111)
Creatinine, Ser: 0.87 mg/dL (ref 0.44–1.00)
GFR calc Af Amer: >60 mL/min (ref >60)
GFR calc non Af Amer: >60 mL/min (ref >60)
Glucose, Bld: 93 mg/dL (ref 65–99)
Potassium: 3.5 mmol/L (ref 3.5–5.1)
Sodium: 137 mmol/L (ref 135–145)

## 2017-09-15 MED ORDER — ADULT MULTIVITAMIN W/MINERALS CH
1.0000 | ORAL_TABLET | Freq: Every day | ORAL | Status: DC
  Administered 2017-09-16: 1 via ORAL
  Filled 2017-09-15 (×2): qty 1

## 2017-09-15 MED ORDER — PNEUMOCOCCAL VAC POLYVALENT 25 MCG/0.5ML IJ INJ
0.5000 mL | INJECTION | INTRAMUSCULAR | Status: DC
  Filled 2017-09-15: qty 0.5

## 2017-09-15 MED ORDER — POTASSIUM CHLORIDE CRYS ER 20 MEQ PO TBCR
40.0000 meq | EXTENDED_RELEASE_TABLET | Freq: Once | ORAL | Status: AC
  Administered 2017-09-15: 40 meq via ORAL
  Filled 2017-09-15: qty 2

## 2017-09-15 NOTE — Progress Notes (Signed)
PROGRESS NOTE    Jamie [name]  XKG:[id number] DOB: 09/03/1965 DOA: 09/14/2017 PCP: Helen Hashimoto., MD   Brief Narrative: Patient is a 52 year old female with past medical history of opioid dependence, IV drug abuse, hepatitis, cirrhosis who presents to the emergency department with complaints of severe abdominal pain.  She was earlier seen at an GI office and she was found to have uncontrolled abdominal pain.  Patient reported that she had taken a heroine about 6 days ago because she was in pain.  She was seen at [facility] about 3 days ago for severe abdominal pain and CT abdomen/pelvis revealed wall thickening of the right colon concerning for colitis.  She was also noted to have urinary tract infection.  She was sent home with Cipro Floxin and Flagyl but she continued to have epigastric abdominal pain.  Her urine culture collected at another hospital was also not sensitive to Cipro so she was advised to come and pick a new prescription. Patient was admitted here for management of colitis and UTI.  Assessment & Plan:   Active Problems:   Hepatic cirrhosis (HCC)   Nausea & vomiting   Abdominal pain   Colitis   Chest wall pain  Abdominal pain/colitis: Currently feeling better with IV antibiotics.  Continue IV Zosyn.  CT abdomen/pelvis done on 5/19 at [facility] was suggestive of ascending and transverse colon  consistent with chronic/remote inflammatory process.  Patient complains of diarrhea today.  Continue IV fluids. On IV pain medications.  Currently on clear liquid diet.We will plan to advance the diet tomorrow to soft.  E. coli UTI: E. coli was resistent to  ciprofloxacin which she was discharged earlier with.  Currently on Zosyn.  Denies any dysuria symptoms.  Will follow blood cultures  Nausea/vomiting: Continue antiemetics as needed.  Chest pain: Resolved.  Atypical on presentation. EKG did not show any ischemic changes.  Troponins negative.  IV drug  abuse: Injected heroine.  UDS positive for benzos and opiates.  Counseled for cessation.  History of hepatitis C/cirrhosis: Mild elevated liver enzymes.  Follows up with GI as an outpatient.     DVT prophylaxis: Lovenox Code Status: Full Family Communication: None present at the bedside Disposition Plan: Likely to home in 1 to 2 days   Consultants: None  Procedures: None  Antimicrobials: Zosyn  Subjective:  Patient seen and examined the bedside this morning.  Her abdomen pain is better today.  Still complains of some abdominal pain though.  Looks comfortable.  Complains of some diarrhea. Objective: Vitals:   09/14/17 2000 09/14/17 2100 09/14/17 2139 09/15/17 0425  BP: 123/86 122/73 131/80 121/90  Pulse: 95 92 86 89  Resp: (!) 23 15 18 18   Temp:   98.2 F (36.8 C) 98.1 F (36.7 C)  TempSrc:   Oral Oral  SpO2: 99% 99% 100% 95%  Weight:   53.9 kg (118 lb 12.8 oz)   Height:   5\' 4"  (1.626 m)     Intake/Output Summary (Last 24 hours) at 09/15/2017 1225 Last data filed at 09/15/2017 0400 Gross per 24 hour  Intake 2166.67 ml  Output 600 ml  Net 1566.67 ml   Filed Weights   09/14/17 2139  Weight: 53.9 kg (118 lb 12.8 oz)    Examination:  General exam: Appears calm and comfortable ,Not in distress,average built HEENT:PERRL,Oral mucosa moist, Ear/Nose normal on gross exam Respiratory system: Bilateral equal air entry, normal vesicular breath sounds, no wheezes or crackles  Cardiovascular system: S1 &  S2 heard, RRR. No JVD, murmurs, rubs, gallops or clicks. No pedal edema. Gastrointestinal system: Abdomen is nondistended, soft and has mild generalized tenderness .No organomegaly or masses felt. Normal bowel sounds heard. Central nervous system: Alert and oriented. No focal neurological deficits. Extremities: No edema, no clubbing ,no cyanosis, distal peripheral pulses palpable. Skin: No rashes, lesions or ulcers,no icterus ,no pallor MSK: Normal muscle bulk,tone  ,power Psychiatry: Judgement and insight appear normal. Mood & affect appropriate.     Data Reviewed: I have personally reviewed following labs and imaging studies  CBC: Recent Labs  Lab 09/14/17 1431 09/15/17 0516  WBC 8.1 5.3  HGB 12.2 10.3*  HCT 36.7 31.3*  MCV 93.6 93.4  PLT 383 161   Basic Metabolic Panel: Recent Labs  Lab 09/14/17 1431 09/15/17 0516  NA 138 137  K 3.4* 3.5  CL 106 108  CO2 17* 20*  GLUCOSE 68 93  BUN <5* <5*  CREATININE 0.94 0.87  CALCIUM 8.1* 7.5*   GFR: Estimated Creatinine Clearance: 64.4 mL/min (by C-G formula based on SCr of 0.87 mg/dL). Liver Function Tests: Recent Labs  Lab 09/14/17 1431  AST 80*  ALT 36  ALKPHOS 178*  BILITOT 0.9  PROT 7.1  ALBUMIN 2.5*   Recent Labs  Lab 09/14/17 1431  LIPASE 27   No results for input(s): AMMONIA in the last 168 hours. Coagulation Profile: No results for input(s): INR, PROTIME in the last 168 hours. Cardiac Enzymes: Recent Labs  Lab 09/14/17 2144  TROPONINI <0.03   BNP (last 3 results) No results for input(s): PROBNP in the last 8760 hours. HbA1C: No results for input(s): HGBA1C in the last 72 hours. CBG: No results for input(s): GLUCAP in the last 168 hours. Lipid Profile: No results for input(s): CHOL, HDL, LDLCALC, TRIG, CHOLHDL, LDLDIRECT in the last 72 hours. Thyroid Function Tests: No results for input(s): TSH, T4TOTAL, FREET4, T3FREE, THYROIDAB in the last 72 hours. Anemia Panel: No results for input(s): VITAMINB12, FOLATE, FERRITIN, TIBC, IRON, RETICCTPCT in the last 72 hours. Sepsis Labs: No results for input(s): PROCALCITON, LATICACIDVEN in the last 168 hours.  No results found for this or any previous visit (from the past 240 hour(s)).       Radiology Studies: Dg Abd Acute W/chest  Result Date: 09/14/2017 CLINICAL DATA:  Abdominal pain, nausea, vomiting EXAM: DG ABDOMEN ACUTE W/ 1V CHEST COMPARISON:  07/12/2017 FINDINGS: Prior cholecystectomy. Nonspecific  bowel gas pattern. Gas within mildly prominent right colon with air-fluid level. No small bowel distention to suggest small bowel obstruction. No free air organomegaly. No suspicious calcification. Heart and mediastinal contours are within normal limits. No focal opacities or effusions. No acute bony abnormality. IMPRESSION: Nonspecific bowel gas pattern. Mildly prominent right colon gas with air-fluid level. This could reflect gastroenteritis. No convincing evidence for obstruction. Prior cholecystectomy. No active cardiopulmonary disease. Electronically Signed   By: Rolm Baptise M.D.   On: 09/14/2017 19:03        Scheduled Meds: . enoxaparin (LOVENOX) injection  40 mg Subcutaneous Q24H  . feeding supplement  1 Container Oral TID BM  . [START ON 09/16/2017] pneumococcal 23 valent vaccine  0.5 mL Intramuscular Tomorrow-1000  . sodium chloride flush  3 mL Intravenous Q12H   Continuous Infusions: . sodium chloride 100 mL/hr (09/14/17 2208)  . piperacillin-tazobactam    . piperacillin-tazobactam (ZOSYN)  IV Stopped (09/15/17 1237)     LOS: 0 days    Time spent: More than 50% of that time was spent in  counseling and/or coordination of care.      Shelly Coss, MD [facility] Pager [phone]  If 7PM-7AM, please contact night-coverage [email or web address] Password [facility] 09/15/2017, 12:25 PM

## 2017-09-15 NOTE — Progress Notes (Signed)
   09/15/17 1300  Clinical Encounter Type  Visited With Patient  Visit Type Initial;Psychological support;Spiritual support  Referral From Nurse  Consult/Referral To Chaplain  Spiritual Encounters  Spiritual Needs Other (Comment) (Advance Directive )  Stress Factors  Patient Stress Factors Other (Comment) (Advance Directive )  Advance Directives (For Healthcare)  Does Patient Have a Medical Advance Directive? Yes  Type of Advance Directive Healthcare Power of Attorney   I visited with the patient and helped the patient complete her Advance Directive, Healthcare Power of Attorney, naming her daughter as her healthcare agent in the event that she is unable to make her own healthcare decisions.  Two witnesses were procured and I notarized the document.  A copy has been placed in the patient's paper chart, copies were made for the patient and the original was given back to the patient.   Please, contact Spiritual Care for further assistance.   Chaplain Shanon Ace M.Div., [facility]
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
## 2017-09-15 NOTE — Care Management Obs Status (Signed)
[facility] NOTIFICATION   Patient Details  Name: Jamie [name] MRN: [id number] Date of Birth: 01-17-1966   Medicare Observation Status Notification Given:  Yes    MahabirJuliann Pulse, RN 09/15/2017, 2:14 PM

## 2017-09-16 DIAGNOSIS — R109 Unspecified abdominal pain: Secondary | ICD-10-CM | POA: Diagnosis not present

## 2017-09-16 LAB — URINE CULTURE: Culture: NO GROWTH

## 2017-09-16 MED ORDER — PANTOPRAZOLE SODIUM 40 MG PO TBEC
40.0000 mg | DELAYED_RELEASE_TABLET | Freq: Every day | ORAL | Status: DC
  Administered 2017-09-16 – 2017-09-17 (×2): 40 mg via ORAL
  Filled 2017-09-16 (×2): qty 1

## 2017-09-16 MED ORDER — AMOXICILLIN-POT CLAVULANATE 875-125 MG PO TABS: 1.0000 | ORAL_TABLET | Freq: Two times a day (BID) | ORAL | 0 refills | Status: AC

## 2017-09-16 MED ORDER — MORPHINE SULFATE (PF) 4 MG/ML IV SOLN: 4.0000 mg | INTRAVENOUS | Status: DC | PRN

## 2017-09-16 MED ORDER — MORPHINE SULFATE (PF) 2 MG/ML IV SOLN
2.0000 mg | INTRAVENOUS | Status: DC | PRN
  Administered 2017-09-16 – 2017-09-17 (×4): 2 mg via INTRAVENOUS
  Filled 2017-09-16 (×4): qty 1

## 2017-09-16 NOTE — Discharge Summary (Addendum)
Physician Discharge Summary  Jamie [name] IRJ:[id number] DOB: 19-Nov-1965 DOA: 09/14/2017  PCP: Helen Hashimoto., MD  Admit date: 09/14/2017 Discharge date: 09/17/17 Admitted From: Home Disposition:  Home  Discharge Condition:Stable CODE STATUS:FULL Diet recommendation: Heart Healthy.Take soft diet for next 2 to 3 days  Brief/Interim Summary:  Patient is a 52 year old female with past medical history of opioid dependence, IV drug abuse, hepatitis, cirrhosis who presents to the emergency department with complaints of severe abdominal pain.  She was earlier seen at an GI office and she was found to have uncontrolled abdominal pain.  Patient reported that she had taken a heroine about 6 days ago because she was in pain.  She was seen at [facility] about 3 days ago for severe abdominal pain and CT abdomen/pelvis revealed wall thickening of the right colon concerning for colitis.  She was also noted to have urinary tract infection.  She was sent home with Cipro Floxin and Flagyl but she continued to have epigastric abdominal pain.  Her urine culture collected at another hospital was also not sensitive to Cipro so she was advised to come and pick a new prescription. Patient has history of hepatitis C and cirrhosis and follows with gastroenterology.  She has history of opiate dependence and was on Suboxone until a few months ago. Patient was admitted here for management of colitis and UTI.  Patient was started on IV antibiotics.  Her abdominal pain has improved and she feels more comfortable today.  She was tolerating clear liquid diet so diet was advanced to soft today. Her overall status is stable.  She is stable for discharge home today with oral antibiotics.  We recommend to take soft diet for next 2 to 3 days at home.  Following problems were addressed during her hospitalization:   Abdominal pain/colitis: Currently feeling better with IV antibiotics. Was on IV Zosyn.  CT  abdomen/pelvis done on 5/19 at [facility] was suggestive of ascending and transverse colon  consistent with chronic/remote inflammatory process.    Patient currently feels comfortable.  Her abdominal pain has improved. Currently on clear liquid diet.We  advanced the diet today to soft and discharge if she tolerates.  E. coli UTI: E. coli was resistent to  ciprofloxacin which she was discharged earlier with.  Currently on Zosyn.  Denies any dysuria symptoms. Blood cultures negative so far.  Discharged on Augmentin to cover for both colitis and UTI  Nausea/vomiting: Much improved.  Continue Zofran Protonix at home.  Chest pain: Resolved.  Atypical on presentation. EKG did not show any ischemic changes.  Troponins negative.  IV drug abuse: Reported to have Injected heroine few days ago.  UDS positive for benzos and opiates.  Counseled for cessation. Patient also complains of bilateral lower extremity pain .Concern for neuropathy.Started on gabapentin.  History of hepatitis C/cirrhosis: Mild elevated liver enzymes.  Follows up with GI as an outpatient. She has bilateral lower extremity edema: Started on Lasix. Will recommend to do a BMP in a week to check for potassium level.     Discharge Diagnoses:  Active Problems:   Hepatic cirrhosis (HCC)   Nausea & vomiting   Abdominal pain   Colitis   Chest wall pain    Discharge Instructions  Discharge Instructions    Diet - low sodium heart healthy   Complete by:  As directed    Discharge instructions   Complete by:  As directed    1) Follow up with your PCP in a week.  2)Take prescribed medication as instructed. 3) Take soft diet for next 2 to 3 days.   Increase activity slowly   Complete by:  As directed      Allergies as of 09/16/2017   No Known Allergies     Medication List    STOP taking these medications   ciprofloxacin 500 MG tablet Commonly known as:  CIPRO   diclofenac 50 MG EC tablet Commonly known as:   VOLTAREN   metroNIDAZOLE 500 MG tablet Commonly known as:  FLAGYL   ondansetron 4 MG disintegrating tablet Commonly known as:  ZOFRAN ODT   pantoprazole 40 MG tablet Commonly known as:  PROTONIX     TAKE these medications   amoxicillin-clavulanate 875-125 MG tablet Commonly known as:  AUGMENTIN Take 1 tablet by mouth 2 (two) times daily for 5 days.   carvedilol 12.5 MG tablet Commonly known as:  COREG Take 12.5 mg by mouth 2 (two) times daily with a meal.   diazepam 5 MG tablet Commonly known as:  VALIUM Take 5-10 mg by mouth. 30 minutes prior to MRI.   omeprazole 40 MG capsule Commonly known as:  PRILOSEC Take 40 mg by mouth daily.      Follow-up Information    Helen Hashimoto., MD. Schedule an appointment as soon as possible for a visit in 1 week(s).   Specialty:  Internal Medicine Contact information: [place] [address] [phone]          No Known Allergies  Consultations: None  Procedures/Studies: Dg Abd Acute W/chest  Result Date: 09/14/2017 CLINICAL DATA:  Abdominal pain, nausea, vomiting EXAM: DG ABDOMEN ACUTE W/ 1V CHEST COMPARISON:  07/12/2017 FINDINGS: Prior cholecystectomy. Nonspecific bowel gas pattern. Gas within mildly prominent right colon with air-fluid level. No small bowel distention to suggest small bowel obstruction. No free air organomegaly. No suspicious calcification. Heart and mediastinal contours are within normal limits. No focal opacities or effusions. No acute bony abnormality. IMPRESSION: Nonspecific bowel gas pattern. Mildly prominent right colon gas with air-fluid level. This could reflect gastroenteritis. No convincing evidence for obstruction. Prior cholecystectomy. No active cardiopulmonary disease. Electronically Signed   By: Rolm Baptise M.D.   On: 09/14/2017 19:03       Subjective: Patient seen and examined the bedside this morning.  Feels much better today.  Abdomen pain has  improved.  Tolerating clear liquid diet.  Stable for discharge to home today after advancement of the  diet with soft.  Discharge Exam: Vitals:   09/15/17 2133 09/16/17 0430  BP: 108/68 113/69  Pulse: 79 78  Resp: 20 18  Temp: 98.6 F (37 C) 97.9 F (36.6 C)  SpO2: 100% 97%   Vitals:   09/15/17 0425 09/15/17 1325 09/15/17 2133 09/16/17 0430  BP: 121/90 126/82 108/68 113/69  Pulse: 89 86 79 78  Resp: 18 19 20 18   Temp: 98.1 F (36.7 C) 98.6 F (37 C) 98.6 F (37 C) 97.9 F (36.6 C)  TempSrc: Oral  Oral Oral  SpO2: 95% 100% 100% 97%  Weight:      Height:        General: Pt is alert, awake, not in acute distress Cardiovascular: RRR, S1/S2 +, no rubs, no gallops Respiratory: CTA bilaterally, no wheezing, no rhonchi Abdominal: Soft, NT, ND, bowel sounds + Extremities: Lower extremity edema, no cyanosis    The results of significant diagnostics from this hospitalization (including imaging, microbiology, ancillary and laboratory) are listed below for reference.  Microbiology: Recent Results (from the past 240 hour(s))  Urine culture     Status: None   Collection Time: 09/14/17  2:46 PM  Result Value Ref Range Status   Specimen Description   Final    URINE, RANDOM Performed at [facility] [address], [place], [place] [address]    Special Requests   Final    NONE Performed at [facility], [place] [address] [place], [place] [address]    Culture   Final    NO GROWTH Performed at [facility], [place] [address], [place], [place] [address]    Report Status 09/16/2017 FINAL  Final  Culture, blood (routine x 2)     Status: None (Preliminary result)   Collection Time: 09/14/17  5:29 PM  Result Value Ref Range Status   Specimen Description   Final    BLOOD RIGHT HAND Performed at [facility] [address], [place], [place] [address]    Special Requests   Final    BOTTLES DRAWN AEROBIC AND ANAEROBIC  Blood Culture adequate volume Performed at [facility] [address] [place], [place] [address]    Culture   Final    NO GROWTH < 24 HOURS Performed at [facility] [address], [place], [place] [address]    Report Status PENDING  Incomplete  Culture, blood (routine x 2)     Status: None (Preliminary result)   Collection Time: 09/14/17  8:58 PM  Result Value Ref Range Status   Specimen Description   Final    BLOOD LEFT HAND Performed at [facility] [address] [place], [place] [address]    Special Requests   Final    BOTTLES DRAWN AEROBIC AND ANAEROBIC Blood Culture adequate volume Performed at [facility] [address] [place], [place] [address]    Culture   Final    NO GROWTH < 24 HOURS Performed at [facility] [address], [place], [place] [address]    Report Status PENDING  Incomplete     Labs: BNP (last 3 results) No results for input(s): BNP in the last 8760 hours. Basic Metabolic Panel: Recent Labs  Lab 09/14/17 1431 09/15/17 0516  NA 138 137  K 3.4* 3.5  CL 106 108  CO2 17* 20*  GLUCOSE 68 93  BUN <5* <5*  CREATININE 0.94 0.87  CALCIUM 8.1* 7.5*   Liver Function Tests: Recent Labs  Lab 09/14/17 1431  AST 80*  ALT 36  ALKPHOS 178*  BILITOT 0.9  PROT 7.1  ALBUMIN 2.5*   Recent Labs  Lab 09/14/17 1431  LIPASE 27   No results for input(s): AMMONIA in the last 168 hours. CBC: Recent Labs  Lab 09/14/17 1431 09/15/17 0516  WBC 8.1 5.3  HGB 12.2 10.3*  HCT 36.7 31.3*  MCV 93.6 93.4  PLT 383 273   Cardiac Enzymes: Recent Labs  Lab 09/14/17 2144  TROPONINI <0.03   BNP: Invalid input(s): POCBNP CBG: No results for input(s): GLUCAP in the last 168 hours. D-Dimer No results for input(s): DDIMER in the last 72 hours. Hgb A1c No results for input(s): HGBA1C in the last 72 hours. Lipid Profile No results for input(s): CHOL, HDL, LDLCALC, TRIG,  CHOLHDL, LDLDIRECT in the last 72 hours. Thyroid function studies No results for input(s): TSH, T4TOTAL, T3FREE, THYROIDAB in the last 72 hours.  Invalid input(s): FREET3 Anemia work up No  results for input(s): VITAMINB12, FOLATE, FERRITIN, TIBC, IRON, RETICCTPCT in the last 72 hours. Urinalysis    Component Value Date/Time   COLORURINE YELLOW 09/14/2017 1446   APPEARANCEUR CLEAR 09/14/2017 1446   LABSPEC 1.005 09/14/2017 1446   PHURINE 7.0 09/14/2017 1446   GLUCOSEU NEGATIVE 09/14/2017 1446   HGBUR SMALL (A) 09/14/2017 1446   BILIRUBINUR NEGATIVE 09/14/2017 1446   KETONESUR NEGATIVE 09/14/2017 1446   PROTEINUR NEGATIVE 09/14/2017 1446   UROBILINOGEN 0.2 06/23/2011 0640   NITRITE NEGATIVE 09/14/2017 1446   LEUKOCYTESUR NEGATIVE 09/14/2017 1446   Sepsis Labs Invalid input(s): PROCALCITONIN,  WBC,  LACTICIDVEN Microbiology Recent Results (from the past 240 hour(s))  Urine culture     Status: None   Collection Time: 09/14/17  2:46 PM  Result Value Ref Range Status   Specimen Description   Final    URINE, RANDOM Performed at [facility], [place] [address], [place], [place] [address]    Special Requests   Final    NONE Performed at [facility], [place] [address] [place], [place] [address]    Culture   Final    NO GROWTH Performed at [facility], [place] [address] [place], [place] [address]    Report Status 09/16/2017 FINAL  Final  Culture, blood (routine x 2)     Status: None (Preliminary result)   Collection Time: 09/14/17  5:29 PM  Result Value Ref Range Status   Specimen Description   Final    BLOOD RIGHT HAND Performed at [facility] [address] [place], [place] [address]    Special Requests   Final    BOTTLES DRAWN AEROBIC AND ANAEROBIC Blood Culture adequate volume Performed at [facility] [address], [place], [place] [address]    Culture   Final    NO GROWTH < 24  HOURS Performed at [facility] [address], [place], [place] [address]    Report Status PENDING  Incomplete  Culture, blood (routine x 2)     Status: None (Preliminary result)   Collection Time: 09/14/17  8:58 PM  Result Value Ref Range Status   Specimen Description   Final    BLOOD LEFT HAND Performed at [facility] [address], [place], [place] [address]    Special Requests   Final    BOTTLES DRAWN AEROBIC AND ANAEROBIC Blood Culture adequate volume Performed at [facility] [address], [place], [place] [address]    Culture   Final    NO GROWTH < 24 HOURS Performed at [facility] [address], [place], [place] [address]    Report Status PENDING  Incomplete    Please note: You were cared for by a hospitalist during your hospital stay. Once you are discharged, your primary care physician will handle any further medical issues. Please note that NO REFILLS for any discharge medications will be authorized once you are discharged, as it is imperative that you return to your primary care physician (or establish a relationship with a primary care physician if you do not have one) for your post hospital discharge needs so that they can reassess your need for medications and monitor your lab values.    Time coordinating discharge: 40 minutes  SIGNED:   Shelly Coss, MD  [facility] 09/16/2017, 10:54 AM Pager [phone]  If 7PM-7AM, please contact night-coverage [email or web address] Password [email or web address]

## 2017-09-16 NOTE — Progress Notes (Addendum)
PROGRESS NOTE    Jamie [name]  KKX:[id number] DOB: February 25, 1966 DOA: 09/14/2017 PCP: Helen Hashimoto., MD   Brief Narrative: Patient is a 52 year old female with past medical history of opioid dependence, IV drug abuse, hepatitis, cirrhosis who presents to the emergency department with complaints of severe abdominal pain.  She was earlier seen at an GI office and she was found to have uncontrolled abdominal pain.  Patient reported that she had taken a heroine about 6 days ago because she was in pain.  She was seen at [facility] about 3 days ago for severe abdominal pain and CT abdomen/pelvis revealed wall thickening of the right colon concerning for colitis.  She was also noted to have urinary tract infection.  She was sent home with Cipro Floxin and Flagyl but she continued to have epigastric abdominal pain.  Her urine culture collected at another hospital was also not sensitive to Cipro so she was advised to come and pick a new prescription. Patient was admitted here for management of colitis and UTI. Her pain had improved this morning and we plan for discharge to home after advance the diet to soft but she again complained of abdominal pain so we are holding the discharge for today.  Assessment & Plan:   Active Problems:   Hepatic cirrhosis (HCC)   Nausea & vomiting   Abdominal pain   Colitis   Chest wall pain  Abdominal pain/colitis: Currently feeling better with IV antibiotics.  Continue IV Zosyn.  CT abdomen/pelvis done on 5/19 at [facility] was suggestive of ascending and transverse colon  consistent with chronic/remote inflammatory process.  Continue IV fluids. On IV pain medications.   Her pain had improved this morning and we plan for discharge to home after advance the diet to soft but she again complained of abdominal pain so we are holding the discharge for today.  E. coli UTI: E. coli was resistent to  ciprofloxacin which she was discharged earlier with.   Currently on Zosyn.  Denies any dysuria symptoms.  Will follow blood cultures  Nausea/vomiting: Continue antiemetics as needed.  Chest pain: Resolved.  Atypical on presentation. EKG did not show any ischemic changes.  Troponins negative.  IV drug abuse: Injected heroine.  UDS positive for benzos and opiates.  Counseled for cessation.  History of hepatitis C/cirrhosis: Mild elevated liver enzymes.  Follows up with GI as an outpatient.     DVT prophylaxis: Lovenox Code Status: Full Family Communication: None present at the bedside Disposition Plan: Likely to home in 1 to 2 days   Consultants: None  Procedures: None  Antimicrobials: Zosyn  Subjective:  Patient seen and examined the bedside this morning.  Her abdomen pain is better today.  Still complains of some abdominal pain though.  Looks comfortable.  Complains of some diarrhea. Objective: Vitals:   09/15/17 0425 09/15/17 1325 09/15/17 2133 09/16/17 0430  BP: 121/90 126/82 108/68 113/69  Pulse: 89 86 79 78  Resp: 18 19 20 18   Temp: 98.1 F (36.7 C) 98.6 F (37 C) 98.6 F (37 C) 97.9 F (36.6 C)  TempSrc: Oral  Oral Oral  SpO2: 95% 100% 100% 97%  Weight:      Height:        Intake/Output Summary (Last 24 hours) at 09/16/2017 1204 Last data filed at 09/16/2017 0700 Gross per 24 hour  Intake 3640 ml  Output 950 ml  Net 2690 ml   Filed Weights   09/14/17 2139  Weight: 53.9 kg (  118 lb 12.8 oz)    Examination:  General exam: Appears calm and comfortable ,Not in distress,average built HEENT:PERRL,Oral mucosa moist, Ear/Nose normal on gross exam Respiratory system: Bilateral equal air entry, normal vesicular breath sounds, no wheezes or crackles  Cardiovascular system: S1 & S2 heard, RRR. No JVD, murmurs, rubs, gallops or clicks. No pedal edema. Gastrointestinal system: Abdomen is nondistended, soft and has mild generalized tenderness .No organomegaly or masses felt. Normal bowel sounds heard. Central nervous  system: Alert and oriented. No focal neurological deficits. Extremities: No edema, no clubbing ,no cyanosis, distal peripheral pulses palpable. Skin: No rashes, lesions or ulcers,no icterus ,no pallor MSK: Normal muscle bulk,tone ,power Psychiatry: Judgement and insight appear normal. Mood & affect appropriate.     Data Reviewed: I have personally reviewed following labs and imaging studies  CBC: Recent Labs  Lab 09/14/17 1431 09/15/17 0516  WBC 8.1 5.3  HGB 12.2 10.3*  HCT 36.7 31.3*  MCV 93.6 93.4  PLT 383 086   Basic Metabolic Panel: Recent Labs  Lab 09/14/17 1431 09/15/17 0516  NA 138 137  K 3.4* 3.5  CL 106 108  CO2 17* 20*  GLUCOSE 68 93  BUN <5* <5*  CREATININE 0.94 0.87  CALCIUM 8.1* 7.5*   GFR: Estimated Creatinine Clearance: 64.4 mL/min (by C-G formula based on SCr of 0.87 mg/dL). Liver Function Tests: Recent Labs  Lab 09/14/17 1431  AST 80*  ALT 36  ALKPHOS 178*  BILITOT 0.9  PROT 7.1  ALBUMIN 2.5*   Recent Labs  Lab 09/14/17 1431  LIPASE 27   No results for input(s): AMMONIA in the last 168 hours. Coagulation Profile: No results for input(s): INR, PROTIME in the last 168 hours. Cardiac Enzymes: Recent Labs  Lab 09/14/17 2144  TROPONINI <0.03   BNP (last 3 results) No results for input(s): PROBNP in the last 8760 hours. HbA1C: No results for input(s): HGBA1C in the last 72 hours. CBG: No results for input(s): GLUCAP in the last 168 hours. Lipid Profile: No results for input(s): CHOL, HDL, LDLCALC, TRIG, CHOLHDL, LDLDIRECT in the last 72 hours. Thyroid Function Tests: No results for input(s): TSH, T4TOTAL, FREET4, T3FREE, THYROIDAB in the last 72 hours. Anemia Panel: No results for input(s): VITAMINB12, FOLATE, FERRITIN, TIBC, IRON, RETICCTPCT in the last 72 hours. Sepsis Labs: No results for input(s): PROCALCITON, LATICACIDVEN in the last 168 hours.  Recent Results (from the past 240 hour(s))  Urine culture     Status: None    Collection Time: 09/14/17  2:46 PM  Result Value Ref Range Status   Specimen Description   Final    URINE, RANDOM Performed at [facility] [address], [place], [place] [address]    Special Requests   Final    NONE Performed at [facility], [place] [address] [place], [place] [address]    Culture   Final    NO GROWTH Performed at [facility], [place] [address], [place], [place] [address]    Report Status 09/16/2017 FINAL  Final  Culture, blood (routine x 2)     Status: None (Preliminary result)   Collection Time: 09/14/17  5:29 PM  Result Value Ref Range Status   Specimen Description   Final    BLOOD RIGHT HAND Performed at [facility] [address], [place], [place] [address]    Special Requests   Final    BOTTLES DRAWN AEROBIC AND ANAEROBIC Blood Culture adequate volume Performed at  [facility], [place] [address], [place], [place] [address]    Culture   Final    NO GROWTH < 24 HOURS Performed at [facility] [address], [place], [place] [address]    Report Status PENDING  Incomplete  Culture, blood (routine x 2)     Status: None (Preliminary result)   Collection Time: 09/14/17  8:58 PM  Result Value Ref Range Status   Specimen Description   Final    BLOOD LEFT HAND Performed at [facility] [address], [place], [place] [address]    Special Requests   Final    BOTTLES DRAWN AEROBIC AND ANAEROBIC Blood Culture adequate volume Performed at [facility] [address], [place], [place] [address]    Culture   Final    NO GROWTH < 24 HOURS Performed at [facility] [address], [place], [place] [address]    Report Status PENDING  Incomplete         Radiology Studies: Dg Abd Acute W/chest  Result Date: 09/14/2017 CLINICAL DATA:  Abdominal pain, nausea, vomiting EXAM: DG ABDOMEN ACUTE W/ 1V CHEST COMPARISON:  07/12/2017  FINDINGS: Prior cholecystectomy. Nonspecific bowel gas pattern. Gas within mildly prominent right colon with air-fluid level. No small bowel distention to suggest small bowel obstruction. No free air organomegaly. No suspicious calcification. Heart and mediastinal contours are within normal limits. No focal opacities or effusions. No acute bony abnormality. IMPRESSION: Nonspecific bowel gas pattern. Mildly prominent right colon gas with air-fluid level. This could reflect gastroenteritis. No convincing evidence for obstruction. Prior cholecystectomy. No active cardiopulmonary disease. Electronically Signed   By: Rolm Baptise M.D.   On: 09/14/2017 19:03        Scheduled Meds: . enoxaparin (LOVENOX) injection  40 mg Subcutaneous Q24H  . feeding supplement  1 Container Oral TID BM  . multivitamin with minerals  1 tablet Oral Daily  . pneumococcal 23 valent vaccine  0.5 mL Intramuscular Tomorrow-1000  . sodium chloride flush  3 mL Intravenous Q12H   Continuous Infusions: . sodium chloride 100 mL/hr at 09/16/17 0116  . piperacillin-tazobactam    . piperacillin-tazobactam (ZOSYN)  IV 3.375 g (09/16/17 0838)     LOS: 0 days    Time spent: 25 mins.More than 50% of that time was spent in counseling and/or coordination of care.      Shelly Coss, MD [facility] Pager [phone]  If 7PM-7AM, please contact night-coverage [email or web address] Password [email or web address] 09/16/2017, 12:04 PM
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
## 2017-09-17 DIAGNOSIS — R112 Nausea with vomiting, unspecified: Secondary | ICD-10-CM | POA: Diagnosis not present

## 2017-09-17 DIAGNOSIS — R109 Unspecified abdominal pain: Secondary | ICD-10-CM | POA: Diagnosis not present

## 2017-09-17 DIAGNOSIS — K7030 Alcoholic cirrhosis of liver without ascites: Secondary | ICD-10-CM | POA: Diagnosis not present

## 2017-09-17 MED ORDER — GABAPENTIN 600 MG PO TABS
300.0000 mg | ORAL_TABLET | Freq: Two times a day (BID) | ORAL | Status: DC
  Filled 2017-09-17: qty 0.5

## 2017-09-17 MED ORDER — FUROSEMIDE 40 MG PO TABS
40.0000 mg | ORAL_TABLET | Freq: Every day | ORAL | Status: DC
  Administered 2017-09-17: 40 mg via ORAL
  Filled 2017-09-17: qty 1

## 2017-09-17 MED ORDER — POTASSIUM CHLORIDE CRYS ER 20 MEQ PO TBCR: 20.0000 meq | EXTENDED_RELEASE_TABLET | Freq: Every day | ORAL | 0 refills | Status: DC

## 2017-09-17 MED ORDER — GABAPENTIN 300 MG PO CAPS
300.0000 mg | ORAL_CAPSULE | Freq: Two times a day (BID) | ORAL | 0 refills | Status: DC
Start: 2017-09-17 — End: 2020-07-24

## 2017-09-17 MED ORDER — FUROSEMIDE 40 MG PO TABS: 40.0000 mg | ORAL_TABLET | Freq: Every day | ORAL | 0 refills | Status: DC

## 2017-09-17 MED ORDER — ONDANSETRON 4 MG PO TBDP: 4.0000 mg | ORAL_TABLET | Freq: Three times a day (TID) | ORAL | 0 refills | Status: DC | PRN

## 2017-09-17 MED ORDER — OMEPRAZOLE 40 MG PO CPDR: 40.0000 mg | DELAYED_RELEASE_CAPSULE | Freq: Every day | ORAL | 0 refills | Status: DC

## 2017-09-17 MED ORDER — GABAPENTIN 300 MG PO CAPS
300.0000 mg | ORAL_CAPSULE | Freq: Two times a day (BID) | ORAL | Status: DC
  Administered 2017-09-17: 300 mg via ORAL
  Filled 2017-09-17: qty 1

## 2017-09-17 NOTE — Progress Notes (Signed)
Pt to be discharged to home this afternoon. Discharge instructions and Medications reviewed with Pt. Medications new reviewed with Pt along with schedules and Medications to be stopped also reviewed with Pt. Pt verbalized understanding of all discharge information including Medications.

## 2017-09-19 LAB — CULTURE, BLOOD (ROUTINE X 2)
Culture: NO GROWTH
Culture: NO GROWTH
Special Requests: ADEQUATE
Special Requests: ADEQUATE

## 2017-09-21 DIAGNOSIS — Z09 Encounter for follow-up examination after completed treatment for conditions other than malignant neoplasm: Secondary | ICD-10-CM | POA: Diagnosis not present

## 2017-09-21 DIAGNOSIS — G629 Polyneuropathy, unspecified: Secondary | ICD-10-CM | POA: Diagnosis not present

## 2017-09-21 DIAGNOSIS — K529 Noninfective gastroenteritis and colitis, unspecified: Secondary | ICD-10-CM | POA: Diagnosis not present

## 2017-09-21 DIAGNOSIS — R600 Localized edema: Secondary | ICD-10-CM | POA: Diagnosis not present

## 2017-09-21 DIAGNOSIS — R109 Unspecified abdominal pain: Secondary | ICD-10-CM | POA: Diagnosis not present

## 2017-09-28 DIAGNOSIS — K921 Melena: Secondary | ICD-10-CM | POA: Diagnosis not present

## 2017-09-28 DIAGNOSIS — N390 Urinary tract infection, site not specified: Secondary | ICD-10-CM | POA: Diagnosis not present

## 2017-09-28 DIAGNOSIS — K7460 Unspecified cirrhosis of liver: Secondary | ICD-10-CM | POA: Diagnosis not present

## 2017-09-28 DIAGNOSIS — R1013 Epigastric pain: Secondary | ICD-10-CM | POA: Diagnosis not present

## 2017-09-29 DIAGNOSIS — M533 Sacrococcygeal disorders, not elsewhere classified: Secondary | ICD-10-CM | POA: Diagnosis not present

## 2017-09-29 DIAGNOSIS — M7918 Myalgia, other site: Secondary | ICD-10-CM | POA: Diagnosis not present

## 2017-09-29 DIAGNOSIS — S34139A Unspecified injury to sacral spinal cord, initial encounter: Secondary | ICD-10-CM | POA: Diagnosis not present

## 2017-09-29 DIAGNOSIS — Z6822 Body mass index (BMI) 22.0-22.9, adult: Secondary | ICD-10-CM | POA: Diagnosis not present

## 2017-10-03 DIAGNOSIS — R52 Pain, unspecified: Secondary | ICD-10-CM | POA: Diagnosis not present

## 2017-10-03 DIAGNOSIS — M5489 Other dorsalgia: Secondary | ICD-10-CM | POA: Diagnosis not present

## 2017-10-03 DIAGNOSIS — Z5329 Procedure and treatment not carried out because of patient's decision for other reasons: Secondary | ICD-10-CM | POA: Diagnosis not present

## 2017-10-03 DIAGNOSIS — R1030 Lower abdominal pain, unspecified: Secondary | ICD-10-CM | POA: Diagnosis not present

## 2017-10-03 DIAGNOSIS — R1084 Generalized abdominal pain: Secondary | ICD-10-CM | POA: Diagnosis not present

## 2017-10-06 DIAGNOSIS — Z09 Encounter for follow-up examination after completed treatment for conditions other than malignant neoplasm: Secondary | ICD-10-CM | POA: Diagnosis not present

## 2017-10-06 DIAGNOSIS — N390 Urinary tract infection, site not specified: Secondary | ICD-10-CM | POA: Diagnosis not present

## 2017-10-06 DIAGNOSIS — Z79899 Other long term (current) drug therapy: Secondary | ICD-10-CM | POA: Diagnosis not present

## 2017-10-06 DIAGNOSIS — Z6822 Body mass index (BMI) 22.0-22.9, adult: Secondary | ICD-10-CM | POA: Diagnosis not present

## 2017-10-20 DIAGNOSIS — G894 Chronic pain syndrome: Secondary | ICD-10-CM | POA: Diagnosis not present

## 2017-10-20 DIAGNOSIS — M5416 Radiculopathy, lumbar region: Secondary | ICD-10-CM | POA: Diagnosis not present

## 2017-10-20 DIAGNOSIS — M5136 Other intervertebral disc degeneration, lumbar region: Secondary | ICD-10-CM | POA: Diagnosis not present

## 2017-11-09 DIAGNOSIS — M5416 Radiculopathy, lumbar region: Secondary | ICD-10-CM | POA: Diagnosis not present

## 2017-11-10 DIAGNOSIS — G629 Polyneuropathy, unspecified: Secondary | ICD-10-CM | POA: Diagnosis not present

## 2017-11-10 DIAGNOSIS — R1310 Dysphagia, unspecified: Secondary | ICD-10-CM | POA: Diagnosis not present

## 2017-11-10 DIAGNOSIS — K921 Melena: Secondary | ICD-10-CM | POA: Diagnosis not present

## 2017-11-10 DIAGNOSIS — K7460 Unspecified cirrhosis of liver: Secondary | ICD-10-CM | POA: Diagnosis not present

## 2017-11-17 DIAGNOSIS — K766 Portal hypertension: Secondary | ICD-10-CM | POA: Diagnosis not present

## 2017-11-17 DIAGNOSIS — K222 Esophageal obstruction: Secondary | ICD-10-CM | POA: Diagnosis not present

## 2017-11-17 DIAGNOSIS — K3189 Other diseases of stomach and duodenum: Secondary | ICD-10-CM | POA: Diagnosis not present

## 2017-11-17 DIAGNOSIS — R1310 Dysphagia, unspecified: Secondary | ICD-10-CM | POA: Diagnosis not present

## 2017-11-17 DIAGNOSIS — K449 Diaphragmatic hernia without obstruction or gangrene: Secondary | ICD-10-CM | POA: Diagnosis not present

## 2017-11-30 ENCOUNTER — Ambulatory Visit
Admission: AD | Admit: 2017-11-30 | Payer: Self-pay | Source: Other Acute Inpatient Hospital | Admitting: Family Medicine

## 2017-11-30 ENCOUNTER — Telehealth: Payer: Self-pay | Admitting: Family Medicine

## 2017-11-30 DIAGNOSIS — R000 Tachycardia, unspecified: Secondary | ICD-10-CM | POA: Diagnosis not present

## 2017-11-30 DIAGNOSIS — K760 Fatty (change of) liver, not elsewhere classified: Secondary | ICD-10-CM | POA: Diagnosis not present

## 2017-11-30 DIAGNOSIS — K922 Gastrointestinal hemorrhage, unspecified: Secondary | ICD-10-CM | POA: Diagnosis not present

## 2017-11-30 DIAGNOSIS — B9689 Other specified bacterial agents as the cause of diseases classified elsewhere: Secondary | ICD-10-CM | POA: Diagnosis not present

## 2017-11-30 DIAGNOSIS — N3000 Acute cystitis without hematuria: Secondary | ICD-10-CM | POA: Diagnosis not present

## 2017-11-30 NOTE — Telephone Encounter (Signed)
Transfer request from [facility] for a GI bleed  Requesting physician: ED Dr. Graylon Good  52 year old woman PMH hepatitis C presented to the emergency department with abdominal pain, nausea, vomiting, reported bright red blood per rectum.  Found to be heme positive.  Reportedly clinically stable with heart rate 100-118, BP 124/59, sat 97%, respirations 18.  No active bleeding.  Hemoglobin 14.6.  BMP unremarkable.  Repeat lactic acid within normal limits.  EDP discussed with Dr. Lyndel Safe with [facility] who will see in consult.  Accepted to medical bed.  Murray Hodgkins, MD [facility] [phone]

## 2017-12-01 ENCOUNTER — Encounter (HOSPITAL_COMMUNITY): Payer: Self-pay | Admitting: Internal Medicine

## 2017-12-01 ENCOUNTER — Observation Stay (HOSPITAL_COMMUNITY)
Admission: AD | Admit: 2017-12-01 | Discharge: 2017-12-04 | Disposition: A | Discharge status: Home / Self Care | Payer: Medicare Other | Source: Other Acute Inpatient Hospital | Attending: Internal Medicine | Admitting: Internal Medicine

## 2017-12-01 ENCOUNTER — Other Ambulatory Visit: Payer: Self-pay

## 2017-12-01 DIAGNOSIS — K640 First degree hemorrhoids: Secondary | ICD-10-CM | POA: Insufficient documentation

## 2017-12-01 DIAGNOSIS — K7030 Alcoholic cirrhosis of liver without ascites: Secondary | ICD-10-CM | POA: Diagnosis not present

## 2017-12-01 DIAGNOSIS — E876 Hypokalemia: Secondary | ICD-10-CM | POA: Diagnosis not present

## 2017-12-01 DIAGNOSIS — R109 Unspecified abdominal pain: Secondary | ICD-10-CM | POA: Diagnosis present

## 2017-12-01 DIAGNOSIS — I10 Essential (primary) hypertension: Secondary | ICD-10-CM | POA: Diagnosis not present

## 2017-12-01 DIAGNOSIS — K449 Diaphragmatic hernia without obstruction or gangrene: Secondary | ICD-10-CM | POA: Diagnosis not present

## 2017-12-01 DIAGNOSIS — R2689 Other abnormalities of gait and mobility: Secondary | ICD-10-CM | POA: Diagnosis not present

## 2017-12-01 DIAGNOSIS — K7460 Unspecified cirrhosis of liver: Secondary | ICD-10-CM | POA: Diagnosis present

## 2017-12-01 DIAGNOSIS — Z79899 Other long term (current) drug therapy: Secondary | ICD-10-CM | POA: Insufficient documentation

## 2017-12-01 DIAGNOSIS — K3189 Other diseases of stomach and duodenum: Secondary | ICD-10-CM | POA: Insufficient documentation

## 2017-12-01 DIAGNOSIS — K5730 Diverticulosis of large intestine without perforation or abscess without bleeding: Secondary | ICD-10-CM | POA: Insufficient documentation

## 2017-12-01 DIAGNOSIS — K635 Polyp of colon: Secondary | ICD-10-CM | POA: Insufficient documentation

## 2017-12-01 DIAGNOSIS — R1084 Generalized abdominal pain: Secondary | ICD-10-CM

## 2017-12-01 DIAGNOSIS — B1920 Unspecified viral hepatitis C without hepatic coma: Secondary | ICD-10-CM | POA: Insufficient documentation

## 2017-12-01 DIAGNOSIS — K922 Gastrointestinal hemorrhage, unspecified: Secondary | ICD-10-CM

## 2017-12-01 DIAGNOSIS — K766 Portal hypertension: Secondary | ICD-10-CM | POA: Diagnosis not present

## 2017-12-01 DIAGNOSIS — K2900 Acute gastritis without bleeding: Principal | ICD-10-CM | POA: Insufficient documentation

## 2017-12-01 DIAGNOSIS — I959 Hypotension, unspecified: Secondary | ICD-10-CM

## 2017-12-01 DIAGNOSIS — K921 Melena: Secondary | ICD-10-CM | POA: Diagnosis not present

## 2017-12-01 DIAGNOSIS — D125 Benign neoplasm of sigmoid colon: Secondary | ICD-10-CM | POA: Diagnosis not present

## 2017-12-01 LAB — PROTIME-INR
INR: 1.12
Prothrombin Time: 14.3 seconds (ref 11.4–15.2)

## 2017-12-01 LAB — CBC
HCT: 37.8 % (ref 36.0–46.0)
HCT: 38.5 % (ref 36.0–46.0)
HCT: 33.1 % — ABNORMAL LOW (ref 36.0–46.0)
Hemoglobin: 12.5 g/dL (ref 12.0–15.0)
Hemoglobin: 12.7 g/dL (ref 12.0–15.0)
Hemoglobin: 10.7 g/dL — ABNORMAL LOW (ref 12.0–15.0)
MCH: 32.6 pg (ref 26.0–34.0)
MCH: 32.6 pg (ref 26.0–34.0)
MCH: 32.2 pg (ref 26.0–34.0)
MCHC: 33.1 g/dL (ref 30.0–36.0)
MCHC: 33 g/dL (ref 30.0–36.0)
MCHC: 32.3 g/dL (ref 30.0–36.0)
MCV: 98.7 fL (ref 78.0–100.0)
MCV: 99 fL (ref 78.0–100.0)
MCV: 99.7 fL (ref 78.0–100.0)
Platelets: 134 10*3/uL — ABNORMAL LOW (ref 150–400)
Platelets: 147 10*3/uL — ABNORMAL LOW (ref 150–400)
Platelets: 118 10*3/uL — ABNORMAL LOW (ref 150–400)
RBC: 3.83 MIL/uL — ABNORMAL LOW (ref 3.87–5.11)
RBC: 3.89 MIL/uL (ref 3.87–5.11)
RBC: 3.32 MIL/uL — ABNORMAL LOW (ref 3.87–5.11)
RDW: 15.5 % (ref 11.5–15.5)
RDW: 15.7 % — ABNORMAL HIGH (ref 11.5–15.5)
RDW: 15.6 % — ABNORMAL HIGH (ref 11.5–15.5)
WBC: 4.2 10*3/uL (ref 4.0–10.5)
WBC: 4.5 10*3/uL (ref 4.0–10.5)
WBC: 3.6 10*3/uL — ABNORMAL LOW (ref 4.0–10.5)

## 2017-12-01 LAB — BASIC METABOLIC PANEL
Anion gap: 9 (ref 5–15)
BUN: <5 mg/dL — ABNORMAL LOW (ref 6–20)
CO2: 24 mmol/L (ref 22–32)
Calcium: 8.2 mg/dL — ABNORMAL LOW (ref 8.9–10.3)
Chloride: 101 mmol/L (ref 98–111)
Creatinine, Ser: 0.85 mg/dL (ref 0.44–1.00)
GFR calc Af Amer: >60 mL/min (ref >60)
GFR calc non Af Amer: >60 mL/min (ref >60)
Glucose, Bld: 81 mg/dL (ref 70–99)
Potassium: 3.8 mmol/L (ref 3.5–5.1)
Sodium: 134 mmol/L — ABNORMAL LOW (ref 135–145)

## 2017-12-01 LAB — TYPE AND SCREEN
ABO/RH(D): O POS
Antibody Screen: NEGATIVE

## 2017-12-01 LAB — HEPATIC FUNCTION PANEL
ALT: 38 U/L (ref 0–44)
AST: 76 U/L — ABNORMAL HIGH (ref 15–41)
Albumin: 2.4 g/dL — ABNORMAL LOW (ref 3.5–5.0)
Alkaline Phosphatase: 222 U/L — ABNORMAL HIGH (ref 38–126)
Bilirubin, Direct: 0.3 mg/dL — ABNORMAL HIGH (ref 0.0–0.2)
Indirect Bilirubin: 0.9 mg/dL (ref 0.3–0.9)
Total Bilirubin: 1.2 mg/dL (ref 0.3–1.2)
Total Protein: 6.1 g/dL — ABNORMAL LOW (ref 6.5–8.1)

## 2017-12-01 LAB — ABO/RH: ABO/RH(D): O POS

## 2017-12-01 MED ORDER — SODIUM CHLORIDE 0.9 % IV SOLN
1.0000 g | Freq: Every day | INTRAVENOUS | Status: DC
  Administered 2017-12-01: 1 g via INTRAVENOUS
  Filled 2017-12-01: qty 1

## 2017-12-01 MED ORDER — MORPHINE SULFATE (PF) 2 MG/ML IV SOLN
2.0000 mg | INTRAVENOUS | Status: DC | PRN
  Administered 2017-12-01 – 2017-12-04 (×14): 2 mg via INTRAVENOUS
  Filled 2017-12-01 (×15): qty 1

## 2017-12-01 MED ORDER — ONDANSETRON HCL 4 MG/2ML IJ SOLN
4.0000 mg | Freq: Four times a day (QID) | INTRAMUSCULAR | Status: DC | PRN
  Administered 2017-12-01 – 2017-12-02 (×3): 4 mg via INTRAVENOUS
  Filled 2017-12-01 (×3): qty 2

## 2017-12-01 MED ORDER — SODIUM CHLORIDE 0.9 % IV SOLN
INTRAVENOUS | Status: AC
  Administered 2017-12-01: 06:00:00 via INTRAVENOUS
  Administered 2017-12-01: 1000 mL via INTRAVENOUS

## 2017-12-01 MED ORDER — ONDANSETRON HCL 4 MG PO TABS
4.0000 mg | ORAL_TABLET | Freq: Four times a day (QID) | ORAL | Status: DC | PRN
  Administered 2017-12-01: 4 mg via ORAL
  Filled 2017-12-01: qty 1

## 2017-12-01 MED ORDER — CARVEDILOL 12.5 MG PO TABS
12.5000 mg | ORAL_TABLET | Freq: Two times a day (BID) | ORAL | Status: DC
  Administered 2017-12-01 – 2017-12-03 (×4): 12.5 mg via ORAL
  Filled 2017-12-01 (×4): qty 1

## 2017-12-01 MED ORDER — PEG 3350-KCL-NA BICARB-NACL 420 G PO SOLR
4000.0000 mL | Freq: Once | ORAL | Status: AC
  Administered 2017-12-01: 4000 mL via ORAL
  Filled 2017-12-01 (×2): qty 4000

## 2017-12-01 MED ORDER — SODIUM CHLORIDE 0.9 % IV SOLN
INTRAVENOUS | Status: DC
  Administered 2017-12-02: 06:00:00 via INTRAVENOUS

## 2017-12-01 MED ORDER — ACETAMINOPHEN 650 MG RE SUPP
650.0000 mg | Freq: Four times a day (QID) | RECTAL | Status: DC | PRN
Start: 2017-12-01 — End: 2017-12-04

## 2017-12-01 MED ORDER — BOOST / RESOURCE BREEZE PO LIQD CUSTOM
1.0000 | Freq: Three times a day (TID) | ORAL | Status: DC
  Administered 2017-12-01 – 2017-12-03 (×2): 1 via ORAL

## 2017-12-01 MED ORDER — SODIUM CHLORIDE 0.9 % IV SOLN
8.0000 mg/h | INTRAVENOUS | Status: DC
  Administered 2017-12-01 – 2017-12-02 (×4): 8 mg/h via INTRAVENOUS
  Filled 2017-12-01 (×3): qty 80
  Filled 2017-12-01: qty 40
  Filled 2017-12-01 (×2): qty 80

## 2017-12-01 MED ORDER — MORPHINE SULFATE (PF) 2 MG/ML IV SOLN: 1.0000 mg | INTRAVENOUS | Status: DC | PRN

## 2017-12-01 MED ORDER — SODIUM CHLORIDE 0.9 % IV SOLN
80.0000 mg | Freq: Once | INTRAVENOUS | Status: AC
  Administered 2017-12-01: 80 mg via INTRAVENOUS
  Filled 2017-12-01: qty 80

## 2017-12-01 MED ORDER — PANTOPRAZOLE SODIUM 40 MG IV SOLR: 40.0000 mg | Freq: Two times a day (BID) | INTRAVENOUS | Status: DC

## 2017-12-01 MED ORDER — ACETAMINOPHEN 325 MG PO TABS
650.0000 mg | ORAL_TABLET | Freq: Four times a day (QID) | ORAL | Status: DC | PRN
  Administered 2017-12-01 – 2017-12-03 (×2): 650 mg via ORAL
  Filled 2017-12-01 (×2): qty 2

## 2017-12-01 NOTE — Progress Notes (Signed)
Initial Nutrition Assessment  DOCUMENTATION CODES:   Not applicable  INTERVENTION:  Boost Breeze po TID, each supplement provides 250 kcal and 9 grams of protein  NUTRITION DIAGNOSIS:   Inadequate oral intake related to altered GI function, nausea, vomiting as evidenced by per patient/family report.  GOAL:   Patient will meet greater than or equal to 90% of their needs  MONITOR:   PO intake, Supplement acceptance  REASON FOR ASSESSMENT:   Malnutrition Screening Tool    ASSESSMENT:   Patient with PMH of liver cirrhosis, ETOH abuse, hepatitis C, HTN, bipolar disorder, IV drug abuse presents with acute GI bleed, and diffuse abdominal pain.   Spoke with patient at bedside. She did not eat anything for 6 days PTA with a 5 pound weight loss and a UBW of 128 pounds. Patient complains she had nausea, vomiting, and severe abdominal pain during this timeframe. Normal PO intake consists of nothing for breakfast, salads, a hamburger, and a small baked potato for lunch, and "not much," for dinner. It appears her PO intake is dramatically affected by ETOH/drug use. Patient ordered a lot of items for clear liquid tray during this RD's visit. Ordered boost breeze for patient, will continue to follow and provide supplemental nutrition as needed following her EGD/colonoscopy tomorrow.  Labs reviewed:  Na 134 Medications reviewed and include:  NS at 66mL/hr   NUTRITION - FOCUSED PHYSICAL EXAM:    Most Recent Value  Orbital Region  No depletion  Upper Arm Region  No depletion  Thoracic and Lumbar Region  No depletion  Buccal Region  No depletion  Temple Region  No depletion  Clavicle Bone Region  No depletion  Clavicle and Acromion Bone Region  No depletion  Scapular Bone Region  No depletion  Dorsal Hand  No depletion  Patellar Region  Mild depletion  Anterior Thigh Region  Mild depletion  Posterior Calf Region  Mild depletion  Edema (RD Assessment)  None       Diet Order:    Diet Order            Diet NPO time specified  Diet effective midnight        Diet clear liquid Room service appropriate? Yes; Fluid consistency: Thin  Diet effective now              EDUCATION NEEDS:   Education needs have been addressed  Skin:  Skin Assessment: Reviewed RN Assessment  Last BM:  12/01/2017  Height:   Ht Readings from Last 1 Encounters:  12/01/17 5\' 4"  (1.626 m)    Weight:   Wt Readings from Last 1 Encounters:  12/01/17 55.7 kg    Ideal Body Weight:  54.54 kg  BMI:  Body mass index is 21.08 kg/m.  Estimated Nutritional Needs:   Kcal:  1600-1800 calories  Protein:  67-80 grams  Fluid:  >1.5L    Satira Anis. Telly Broberg, MS, RD LDN Inpatient Clinical Dietitian Pager [phone]
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
## 2017-12-01 NOTE — Progress Notes (Signed)
Patient resting in bad, NAD noted. Patient states pain is 10/10. This nurse offered patient ordered pain medication and or nausea medication. Patient states "I have to have something stronger than that" "that's not going to touch it" I explained to patient that this is what is available to her for pain control patient states "so I have to lay here and suffer" I explained to patient once more that she does have pain medication available to her. Patient refuses to take the ordered pain medication at this time because "its not strong" will continue to monitor and treat patient per MD orders
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
## 2017-12-01 NOTE — Progress Notes (Signed)
Patient is a 52 year old female with past medical history of alcoholic and hepatitis C cirrhosis, history of IV drug abuse who presented to [facility] due to abdominal pain, rectal bleeding.  Patient reported 3 episodes of bright red blood per rectum.  Also complained of abdominal distention.  CT abdomen/pelvis done at another hospital did not show any acute intra-abdominal findings.  Patient was transferred here for evaluation from GI.  GI has already been consulted.  Plan is to do EGD/colonoscopy tomorrow. Patient seen and examined the bedside this morning.  Remains hemodynamically stable.  No new episodes of rectal bleeding.  Complains of abdominal pain and distention.  Currently she is on clear liquid diet.  Will be n.p.o. after midnight. Patient seen by Dr. Hal Hope this morning.

## 2017-12-02 ENCOUNTER — Observation Stay (HOSPITAL_COMMUNITY): Payer: Medicare Other | Admitting: Anesthesiology

## 2017-12-02 ENCOUNTER — Encounter (HOSPITAL_COMMUNITY): Admission: AD | Disposition: A | Discharge status: Home / Self Care | Payer: Self-pay | Attending: Internal Medicine

## 2017-12-02 ENCOUNTER — Encounter (HOSPITAL_COMMUNITY): Payer: Self-pay

## 2017-12-02 DIAGNOSIS — K449 Diaphragmatic hernia without obstruction or gangrene: Secondary | ICD-10-CM | POA: Diagnosis not present

## 2017-12-02 DIAGNOSIS — E876 Hypokalemia: Secondary | ICD-10-CM | POA: Diagnosis not present

## 2017-12-02 DIAGNOSIS — R2689 Other abnormalities of gait and mobility: Secondary | ICD-10-CM | POA: Diagnosis not present

## 2017-12-02 DIAGNOSIS — I10 Essential (primary) hypertension: Secondary | ICD-10-CM | POA: Diagnosis not present

## 2017-12-02 DIAGNOSIS — Z79899 Other long term (current) drug therapy: Secondary | ICD-10-CM | POA: Diagnosis not present

## 2017-12-02 DIAGNOSIS — I959 Hypotension, unspecified: Secondary | ICD-10-CM | POA: Diagnosis not present

## 2017-12-02 DIAGNOSIS — K3189 Other diseases of stomach and duodenum: Secondary | ICD-10-CM | POA: Diagnosis not present

## 2017-12-02 DIAGNOSIS — K5730 Diverticulosis of large intestine without perforation or abscess without bleeding: Secondary | ICD-10-CM | POA: Diagnosis not present

## 2017-12-02 DIAGNOSIS — K635 Polyp of colon: Secondary | ICD-10-CM | POA: Diagnosis not present

## 2017-12-02 DIAGNOSIS — D125 Benign neoplasm of sigmoid colon: Secondary | ICD-10-CM | POA: Diagnosis not present

## 2017-12-02 DIAGNOSIS — K921 Melena: Secondary | ICD-10-CM | POA: Diagnosis not present

## 2017-12-02 DIAGNOSIS — K766 Portal hypertension: Secondary | ICD-10-CM | POA: Diagnosis not present

## 2017-12-02 DIAGNOSIS — K640 First degree hemorrhoids: Secondary | ICD-10-CM | POA: Diagnosis not present

## 2017-12-02 DIAGNOSIS — K922 Gastrointestinal hemorrhage, unspecified: Secondary | ICD-10-CM | POA: Diagnosis not present

## 2017-12-02 DIAGNOSIS — K2900 Acute gastritis without bleeding: Secondary | ICD-10-CM | POA: Diagnosis not present

## 2017-12-02 HISTORY — PX: ESOPHAGOGASTRODUODENOSCOPY (EGD) WITH PROPOFOL: SHX5813

## 2017-12-02 HISTORY — PX: BIOPSY: SHX5522

## 2017-12-02 HISTORY — PX: POLYPECTOMY: SHX5525

## 2017-12-02 HISTORY — PX: COLONOSCOPY WITH PROPOFOL: SHX5780

## 2017-12-02 LAB — BASIC METABOLIC PANEL
Anion gap: 10 (ref 5–15)
BUN: <5 mg/dL — ABNORMAL LOW (ref 6–20)
CO2: 21 mmol/L — ABNORMAL LOW (ref 22–32)
Calcium: 8 mg/dL — ABNORMAL LOW (ref 8.9–10.3)
Chloride: 106 mmol/L (ref 98–111)
Creatinine, Ser: 0.84 mg/dL (ref 0.44–1.00)
GFR calc Af Amer: >60 mL/min (ref >60)
GFR calc non Af Amer: >60 mL/min (ref >60)
Glucose, Bld: 81 mg/dL (ref 70–99)
Potassium: 3.9 mmol/L (ref 3.5–5.1)
Sodium: 137 mmol/L (ref 135–145)

## 2017-12-02 LAB — CBC WITH DIFFERENTIAL/PLATELET
Abs Immature Granulocytes: 0 10*3/uL (ref 0.0–0.1)
Basophils Absolute: 0 10*3/uL (ref 0.0–0.1)
Basophils Relative: 1 %
Eosinophils Absolute: 0.1 10*3/uL (ref 0.0–0.7)
Eosinophils Relative: 3 %
HCT: 36.9 % (ref 36.0–46.0)
Hemoglobin: 12 g/dL (ref 12.0–15.0)
Immature Granulocytes: 0 %
Lymphocytes Relative: 35 %
Lymphs Abs: 1.3 10*3/uL (ref 0.7–4.0)
MCH: 32.4 pg (ref 26.0–34.0)
MCHC: 32.5 g/dL (ref 30.0–36.0)
MCV: 99.7 fL (ref 78.0–100.0)
Monocytes Absolute: 0.4 10*3/uL (ref 0.1–1.0)
Monocytes Relative: 10 %
Neutro Abs: 1.9 10*3/uL (ref 1.7–7.7)
Neutrophils Relative %: 51 %
Platelets: 132 10*3/uL — ABNORMAL LOW (ref 150–400)
RBC: 3.7 MIL/uL — ABNORMAL LOW (ref 3.87–5.11)
RDW: 15.3 % (ref 11.5–15.5)
WBC: 3.6 10*3/uL — ABNORMAL LOW (ref 4.0–10.5)

## 2017-12-02 LAB — URINALYSIS, ROUTINE W REFLEX MICROSCOPIC
Bilirubin Urine: NEGATIVE
Glucose, UA: NEGATIVE mg/dL
Hgb urine dipstick: NEGATIVE
Ketones, ur: NEGATIVE mg/dL
Leukocytes, UA: NEGATIVE
Nitrite: NEGATIVE
Protein, ur: NEGATIVE mg/dL
Specific Gravity, Urine: 1.003 — ABNORMAL LOW (ref 1.005–1.030)
pH: 6 (ref 5.0–8.0)

## 2017-12-02 LAB — COMPREHENSIVE METABOLIC PANEL
ALT: 27 U/L (ref 0–44)
AST: 48 U/L — ABNORMAL HIGH (ref 15–41)
Albumin: 2.5 g/dL — ABNORMAL LOW (ref 3.5–5.0)
Alkaline Phosphatase: 174 U/L — ABNORMAL HIGH (ref 38–126)
Anion gap: 9 (ref 5–15)
BUN: <5 mg/dL — ABNORMAL LOW (ref 6–20)
CO2: 20 mmol/L — ABNORMAL LOW (ref 22–32)
Calcium: 7.4 mg/dL — ABNORMAL LOW (ref 8.9–10.3)
Chloride: 105 mmol/L (ref 98–111)
Creatinine, Ser: 0.97 mg/dL (ref 0.44–1.00)
GFR calc Af Amer: >60 mL/min (ref >60)
GFR calc non Af Amer: >60 mL/min (ref >60)
Glucose, Bld: 113 mg/dL — ABNORMAL HIGH (ref 70–99)
Potassium: 3 mmol/L — ABNORMAL LOW (ref 3.5–5.1)
Sodium: 134 mmol/L — ABNORMAL LOW (ref 135–145)
Total Bilirubin: 0.7 mg/dL (ref 0.3–1.2)
Total Protein: 5.9 g/dL — ABNORMAL LOW (ref 6.5–8.1)

## 2017-12-02 LAB — CBC
HCT: 37.1 % (ref 36.0–46.0)
Hemoglobin: 12.1 g/dL (ref 12.0–15.0)
MCH: 32.4 pg (ref 26.0–34.0)
MCHC: 32.6 g/dL (ref 30.0–36.0)
MCV: 99.2 fL (ref 78.0–100.0)
Platelets: 139 10*3/uL — ABNORMAL LOW (ref 150–400)
RBC: 3.74 MIL/uL — ABNORMAL LOW (ref 3.87–5.11)
RDW: 15.5 % (ref 11.5–15.5)
WBC: 4.1 10*3/uL (ref 4.0–10.5)

## 2017-12-02 LAB — GLUCOSE, CAPILLARY: Glucose-Capillary: 103 mg/dL — ABNORMAL HIGH (ref 70–99)

## 2017-12-02 LAB — TROPONIN I: Troponin I: <0.03 ng/mL (ref <0.03)

## 2017-12-02 LAB — CORTISOL: Cortisol, Plasma: 22.8 ug/dL

## 2017-12-02 SURGERY — COLONOSCOPY WITH PROPOFOL
Anesthesia: Monitor Anesthesia Care

## 2017-12-02 MED ORDER — FENTANYL CITRATE (PF) 100 MCG/2ML IJ SOLN
INTRAMUSCULAR | Status: AC
  Filled 2017-12-02: qty 2

## 2017-12-02 MED ORDER — SODIUM CHLORIDE 0.9 % IV BOLUS
1000.0000 mL | Freq: Once | INTRAVENOUS | Status: AC
  Administered 2017-12-02: 1000 mL via INTRAVENOUS

## 2017-12-02 MED ORDER — PROPOFOL 10 MG/ML IV BOLUS
INTRAVENOUS | Status: DC | PRN
  Administered 2017-12-02: 40 mg via INTRAVENOUS
  Administered 2017-12-02: 20 mg via INTRAVENOUS
  Administered 2017-12-02 (×5): 40 mg via INTRAVENOUS

## 2017-12-02 MED ORDER — PROPOFOL 500 MG/50ML IV EMUL
INTRAVENOUS | Status: DC | PRN
  Administered 2017-12-02: 75 ug/kg/min via INTRAVENOUS

## 2017-12-02 MED ORDER — LIDOCAINE HCL (CARDIAC) PF 100 MG/5ML IV SOSY
PREFILLED_SYRINGE | INTRAVENOUS | Status: DC | PRN
  Administered 2017-12-02: 40 mg via INTRAVENOUS

## 2017-12-02 MED ORDER — FUROSEMIDE 40 MG PO TABS
40.0000 mg | ORAL_TABLET | Freq: Every day | ORAL | Status: DC
  Administered 2017-12-02 – 2017-12-04 (×3): 40 mg via ORAL
  Filled 2017-12-02 (×3): qty 1

## 2017-12-02 MED ORDER — GABAPENTIN 300 MG PO CAPS
300.0000 mg | ORAL_CAPSULE | Freq: Two times a day (BID) | ORAL | Status: DC
  Administered 2017-12-02 – 2017-12-04 (×5): 300 mg via ORAL
  Filled 2017-12-02 (×5): qty 1

## 2017-12-02 MED ORDER — PANTOPRAZOLE SODIUM 40 MG PO TBEC
40.0000 mg | DELAYED_RELEASE_TABLET | Freq: Every day | ORAL | Status: DC
  Administered 2017-12-02 – 2017-12-04 (×3): 40 mg via ORAL
  Filled 2017-12-02 (×3): qty 1

## 2017-12-02 MED ORDER — FENTANYL CITRATE (PF) 100 MCG/2ML IJ SOLN
25.0000 ug | Freq: Once | INTRAMUSCULAR | Status: AC
  Administered 2017-12-02: 25 ug via INTRAVENOUS

## 2017-12-02 SURGICAL SUPPLY — 25 items

## 2017-12-02 NOTE — Progress Notes (Signed)
Patient back from Endo alert and oriented x 4; no acute distress noted, no complaints. Will continue to monitor.

## 2017-12-02 NOTE — Progress Notes (Addendum)
PROGRESS NOTE    Jamie [name]  LGX:[id number] DOB: [birth year] DOA: 12/01/2017 PCP: Helen Hashimoto., MD   Brief Narrative: Patient is a 52 year old female with past medical history of alcoholic and hepatitis C cirrhosis, history of IV drug abuse who presented to [facility] due to abdominal pain, rectal bleeding.  Patient reported 3 episodes of bright red blood per rectum.  Also complained of abdominal distention.  CT abdomen/pelvis done at  did not show any acute intra-abdominal findings.  Patient was transferred here for evaluation from GI.  Patient underwent EGD and colonoscopy by GI here today.  Found to have acute gastritis, portal hypertensive gastropathy, diverticulosis.  Bleeding was suspected to be from diverticular versus hemorrhoidal source.  Plan is to discharge home tomorrow if her hemoglobin is stable.  Assessment & Plan:   Principal Problem:   Acute GI bleeding Active Problems:   Hepatic cirrhosis (HCC)   Abdominal pain   Essential hypertension  Acute GI bleeding: Underwent EGD and colonoscopy by GI today.  Found to have acute gastritis, portal hypertensive gastropathy, diverticulosis.  No active bleeding seen.Bleeding was suspected to be from diverticular versus hemorrhoidal source.  Bleeding has stopped.  Check CBC tomorrow.  She is hemodynamically stable.  Diffuse abdominal pain: Currently abdomen pain has much improved.  CT abdomen/pelvis done at [facility] showed resolved colitis.  History of hypertension: We will resume her home medications..  We will continue to monitor her blood pressure.  History of cirrhosis/portal hypertension: Continue beta-blockers and diuretics.  Follows with GI as an outpatient  History of IV drug abuse: States that she has stopped using IV drugs about 5 months ago.  Bilateral lower extremity tingling/numbness: Continue gabapentin     DVT prophylaxis: SCD Code Status: Full Family Communication: None present at the  bedside Disposition Plan: Discharge to home tomorrow after checking CBC in the morning.Pending PT evaluation.   Consultants: GI  Procedures:EGD, colonoscopy  Antimicrobials: None  Subjective: Patient seen and examined the pressure this morning.  Underwent EGD and coloscopy today.  Feels better.  Complains of bilateral lower extremity numbness.  Hemodynamically stable.  Objective: Vitals:   12/02/17 1130 12/02/17 1140 12/02/17 1150 12/02/17 1231  BP: (!) 160/72 (!) 154/56 (!) 141/67 (!) 157/96  Pulse: 86 69 69 80  Resp: 14 17 13 18   Temp:    98.5 F (36.9 C)  TempSrc:    Oral  SpO2: 100% 100% 100% 100%  Weight:      Height:        Intake/Output Summary (Last 24 hours) at 12/02/2017 1406 Last data filed at 12/02/2017 1229 Gross per 24 hour  Intake 1720.37 ml  Output -  Net 1720.37 ml   Filed Weights   12/01/17 0300  Weight: 55.7 kg    Examination:  General exam: Appears calm and comfortable ,Not in distress,average built HEENT:PERRL,Oral mucosa moist, Ear/Nose normal on gross exam Respiratory system: Bilateral equal air entry, normal vesicular breath sounds, no wheezes or crackles  Cardiovascular system: S1 & S2 heard, RRR. No JVD, murmurs, rubs, gallops or clicks. No pedal edema. Gastrointestinal system: Abdomen is nondistended, soft and nontender. No organomegaly or masses felt. Normal bowel sounds heard. Central nervous system: Alert and oriented. No focal neurological deficits. Extremities: No edema, no clubbing ,no cyanosis, distal peripheral pulses palpable. Skin: No rashes, lesions or ulcers,no icterus ,no pallor MSK: Normal muscle bulk,tone ,power Psychiatry: Judgement and insight appear normal. Mood & affect appropriate.     Data Reviewed: I have  personally reviewed following labs and imaging studies  CBC: Recent Labs  Lab 12/01/17 0439 12/01/17 0819 12/01/17 1137 12/02/17 0553  WBC 4.2 4.5 3.6* 3.6*  NEUTROABS  --   --   --  1.9  HGB 12.5 12.7  10.7* 12.0  HCT 37.8 38.5 33.1* 36.9  MCV 98.7 99.0 99.7 99.7  PLT 134* 147* 118* 979*   Basic Metabolic Panel: Recent Labs  Lab 12/01/17 0439 12/02/17 0553  NA 134* 137  K 3.8 3.9  CL 101 106  CO2 24 21*  GLUCOSE 81 81  BUN <5* <5*  CREATININE 0.85 0.84  CALCIUM 8.2* 8.0*   GFR: Estimated Creatinine Clearance: 67.7 mL/min (by C-G formula based on SCr of 0.84 mg/dL). Liver Function Tests: Recent Labs  Lab 12/01/17 0439  AST 76*  ALT 38  ALKPHOS 222*  BILITOT 1.2  PROT 6.1*  ALBUMIN 2.4*   No results for input(s): LIPASE, AMYLASE in the last 168 hours. No results for input(s): AMMONIA in the last 168 hours. Coagulation Profile: Recent Labs  Lab 12/01/17 0439  INR 1.12   Cardiac Enzymes: No results for input(s): CKTOTAL, CKMB, CKMBINDEX, TROPONINI in the last 168 hours. BNP (last 3 results) No results for input(s): PROBNP in the last 8760 hours. HbA1C: No results for input(s): HGBA1C in the last 72 hours. CBG: No results for input(s): GLUCAP in the last 168 hours. Lipid Profile: No results for input(s): CHOL, HDL, LDLCALC, TRIG, CHOLHDL, LDLDIRECT in the last 72 hours. Thyroid Function Tests: No results for input(s): TSH, T4TOTAL, FREET4, T3FREE, THYROIDAB in the last 72 hours. Anemia Panel: No results for input(s): VITAMINB12, FOLATE, FERRITIN, TIBC, IRON, RETICCTPCT in the last 72 hours. Sepsis Labs: No results for input(s): PROCALCITON, LATICACIDVEN in the last 168 hours.  No results found for this or any previous visit (from the past 240 hour(s)).       Radiology Studies: No results found.      Scheduled Meds: . carvedilol  12.5 mg Oral BID WC  . feeding supplement  1 Container Oral TID BM  . furosemide  40 mg Oral Daily  . gabapentin  300 mg Oral BID  . [START ON 12/04/2017] pantoprazole  40 mg Intravenous Q12H   Continuous Infusions: . sodium chloride 10 mL/hr at 12/02/17 0546  . pantoprozole (PROTONIX) infusion 8 mg/hr (12/02/17 1229)      LOS: 0 days    Time spent:25 mins. More than 50% of that time was spent in counseling and/or coordination of care.      Shelly Coss, MD [facility] Pager [phone]  If 7PM-7AM, please contact night-coverage [email or web address] Password [email or web address] 12/02/2017, 2:06 PM

## 2017-12-02 NOTE — Op Note (Signed)
[facility] Patient Name: Jamie [name] Procedure Date : 12/02/2017 MRN: [id number] Attending MD: Lear Ng , MD Date of Birth: 1965/05/12 CSN: [id number] Age: 52 Admit Type: Inpatient Procedure:                Colonoscopy Indications:              This is the patient's first colonoscopy,                            Hematochezia Providers:                Lear Ng, MD, Zenon Mayo, RN, Alan Mulder, Technician Referring MD:             hospital team Medicines:                Propofol per Anesthesia, Monitored Anesthesia Care Complications:            No immediate complications. Estimated Blood Loss:     Estimated blood loss was minimal. Procedure:                Pre-Anesthesia Assessment:                           - Prior to the procedure, a History and Physical                            was performed, and patient medications and                            allergies were reviewed. The patient's tolerance of                            previous anesthesia was also reviewed. The risks                            and benefits of the procedure and the sedation                            options and risks were discussed with the patient.                            All questions were answered, and informed consent                            was obtained. Prior Anticoagulants: The patient has                            taken no previous anticoagulant or antiplatelet                            agents. ASA Grade Assessment: III - A patient with  severe systemic disease. After reviewing the risks                            and benefits, the patient was deemed in                            satisfactory condition to undergo the procedure.                           After obtaining informed consent, the colonoscope                            was passed under direct vision. Throughout the    procedure, the patient's blood pressure, pulse, and                            oxygen saturations were monitored continuously. The                            [id number]-[id number] ([id number]) peds colon was introduced                            through the anus and advanced to the the cecum,                            identified by appendiceal orifice and ileocecal                            valve. The colonoscopy was performed with                            difficulty due to significant looping and a                            tortuous colon. Successful completion of the                            procedure was aided by straightening and shortening                            the scope to obtain bowel loop reduction. The                            patient tolerated the procedure well. The quality                            of the bowel preparation was adequate and good. The                            terminal ileum, ileocecal valve, appendiceal                            orifice, and rectum were photographed. Scope In: 10:43:29 AM Scope Out: 11:03:36 AM Scope Withdrawal Time: 0  hours 14 minutes 18 seconds  Total Procedure Duration: 0 hours 20 minutes 7 seconds  Findings:      The perianal and digital rectal examinations were normal.      A 7 mm polyp was found in the sigmoid colon. The polyp was semi-sessile.       The polyp was removed with a hot snare. Resection and retrieval were       complete. Estimated blood loss: none.      A 5 mm polyp was found in the sigmoid colon. The polyp was flat. The       polyp was removed with a cold biopsy forceps. Resection and retrieval       were complete. Estimated blood loss was minimal.      A few small-mouthed diverticula were found in the sigmoid colon.      Internal hemorrhoids were found during retroflexion. The hemorrhoids       were small and Grade I (internal hemorrhoids that do not prolapse).      The terminal ileum appeared normal. Impression:                - One 7 mm polyp in the sigmoid colon, removed with                            a hot snare. Resected and retrieved.                           - One 5 mm polyp in the sigmoid colon, removed with                            a cold biopsy forceps. Resected and retrieved.                           - Diverticulosis in the sigmoid colon.                           - Internal hemorrhoids.                           - The examined portion of the ileum was normal. Recommendation:            Procedure Code(s):        --- Professional ---                           [phone], Colonoscopy, flexible; with removal of                            tumor(s), polyp(s), or other lesion(s) by snare                            technique                           45380, 59, Colonoscopy, flexible; with biopsy,                            single or multiple Diagnosis Code(s):        ---  Professional ---                           K92.1, Melena (includes Hematochezia)                           D12.5, Benign neoplasm of sigmoid colon                           K64.0, First degree hemorrhoids                           K57.30, Diverticulosis of large intestine without                            perforation or abscess without bleeding CPT copyright 2017 [employer]. All rights reserved. The codes documented in this report are preliminary and upon coder review may  be revised to meet current compliance requirements. Lear Ng, MD 12/02/2017 11:30:32 AM This report has been signed electronically. Number of Addenda: 0

## 2017-12-02 NOTE — Progress Notes (Addendum)
Patient called Probation officer in the room and she verbalized that she is not feeling well, she is feeling dizzy and light headed. CBG was checked 103 and BP 55/42 (automatic) with Pulse 58; BP checked manual was 62/51 with Pulse 60. MD was called and per his order patient received NS 1000 ml bolus and STAT CBC was ordered. Also, rapid response was called. After the bolus started BP came up to 87/61 with Pulse 74. At 19:05 BP 100/59 with Pulse 74. Will continue to monitor.

## 2017-12-02 NOTE — Op Note (Signed)
[facility] Patient Name: Jamie [name] Procedure Date : 12/02/2017 MRN: [id number] Attending MD: Lear Ng , MD Date of Birth: [birth year] CSN: [id number] Age: 52 Admit Type: Inpatient Procedure:                Upper GI endoscopy Indications:              Suspected upper gastrointestinal bleeding, Acute                            post hemorrhagic anemia, Hematochezia Providers:                Lear Ng, MD, Zenon Mayo, RN, Alan Mulder, Technician Referring MD:             hospital team Medicines:                Propofol per Anesthesia, Monitored Anesthesia Care Complications:            No immediate complications. Estimated Blood Loss:     Estimated blood loss: none. Procedure:                Pre-Anesthesia Assessment:                           - Prior to the procedure, a History and Physical                            was performed, and patient medications and                            allergies were reviewed. The patient's tolerance of                            previous anesthesia was also reviewed. The risks                            and benefits of the procedure and the sedation                            options and risks were discussed with the patient.                            All questions were answered, and informed consent                            was obtained. Prior Anticoagulants: The patient has                            taken no previous anticoagulant or antiplatelet                            agents. ASA Grade Assessment: III - A patient with  severe systemic disease. After reviewing the risks                            and benefits, the patient was deemed in                            satisfactory condition to undergo the procedure.                           After obtaining informed consent, the endoscope was                            passed under direct vision.  Throughout the                            procedure, the patient's blood pressure, pulse, and                            oxygen saturations were monitored continuously. The                            GIF-H190 ([id number]) Olympus Adult EGD was introduced                            through the mouth, and advanced to the second part                            of duodenum. The upper GI endoscopy was                            accomplished without difficulty. The patient                            tolerated the procedure well. Scope In: Scope Out: Findings:      The examined esophagus was normal.      The Z-line was regular and was found 38 cm from the incisors.      Segmental mild inflammation characterized by congestion (edema) was       found in the gastric antrum.      A small hiatal hernia was present.      Mild portal hypertensive gastropathy was found in the gastric body.      The examined duodenum was normal. Impression:               - Normal esophagus.                           - Z-line regular, 38 cm from the incisors.                           - Acute gastritis.                           - Small hiatal hernia.                           -  Portal hypertensive gastropathy.                           - Normal examined duodenum.                           - No specimens collected. Recommendation:           - Observe patient's clinical course. Procedure Code(s):        --- Professional ---                           [phone], Esophagogastroduodenoscopy, flexible,                            transoral; diagnostic, including collection of                            specimen(s) by brushing or washing, when performed                            (separate procedure) Diagnosis Code(s):        --- Professional ---                           K92.1, Melena (includes Hematochezia)                           D62, Acute posthemorrhagic anemia                           K76.6, Portal hypertension                            K29.00, Acute gastritis without bleeding                           K44.9, Diaphragmatic hernia without obstruction or                            gangrene                           K31.89, Other diseases of stomach and duodenum CPT copyright 2017 [employer]. All rights reserved. The codes documented in this report are preliminary and upon coder review may  be revised to meet current compliance requirements. Lear Ng, MD 12/02/2017 11:26:18 AM This report has been signed electronically. Number of Addenda: 0

## 2017-12-02 NOTE — Interval H&P Note (Signed)
History and Physical Interval Note:  12/02/2017 9:27 AM  Jamie [name]  has presented today for surgery, with the diagnosis of GI bleed; Cirrhosis; Abdominal pain  The various methods of treatment have been discussed with the patient and family. After consideration of risks, benefits and other options for treatment, the patient has consented to  Procedure(s): COLONOSCOPY WITH PROPOFOL (N/A) ESOPHAGOGASTRODUODENOSCOPY (EGD) WITH PROPOFOL (N/A) as a surgical intervention .  The patient's history has been reviewed, patient examined, no change in status, stable for surgery.  I have reviewed the patient's chart and labs.  Questions were answered to the patient's satisfaction.     Copeland C.

## 2017-12-02 NOTE — Brief Op Note (Signed)
Suspect bleeding was due to diverticular vs hemorrhoidal source. See endopro note for details. Low sodium diet. Supportive care. D/C tomorrow if stable. F/U with Dr. Paulita Fujita in 3-4 weeks.

## 2017-12-02 NOTE — Transfer of Care (Signed)
Immediate Anesthesia Transfer of Care Note  Patient: Jamie [name]  Procedure(s) Performed: COLONOSCOPY WITH PROPOFOL (N/A ) ESOPHAGOGASTRODUODENOSCOPY (EGD) WITH PROPOFOL (N/A ) POLYPECTOMY  Patient Location: PACU  Anesthesia Type:MAC  Level of Consciousness: awake, alert , drowsy and patient cooperative  Airway & Oxygen Therapy: Patient Spontanous Breathing and Patient connected to nasal cannula oxygen  Post-op Assessment: Report given to RN and Post -op Vital signs reviewed and stable  Post vital signs: Reviewed and stable  Last Vitals:  Vitals Value Taken Time  BP 118/77 12/02/2017 11:09 AM  Temp    Pulse 92 12/02/2017 11:09 AM  Resp 23 12/02/2017 11:09 AM  SpO2 100 % 12/02/2017 11:09 AM  Vitals shown include unvalidated device data.  Last Pain:  Vitals:   12/02/17 1109  TempSrc:   PainSc: 0-No pain      Patients Stated Pain Goal: 4 (16/07/37 1062)  Complications: No apparent anesthesia complications

## 2017-12-02 NOTE — Progress Notes (Signed)
Patient ID: Jamie [name], female   DOB: Apr 01, 1966, 52 y.o.   MRN: [id number]  Xcover  Bp 55/42? Pt slightly light headed Apparently had carvedilol and morphine earlier about 2 hours ago? Denies fever, chills, cp, palp, sob, n/v, hematemesis, rectal bleeding , dysuria, hematuira.   Exam:   T. 98.8, P 60, Bp 100/59 Heent: anicteric Neck no jvd Heart: rrr s1, s2 Lung: ctab ABd: soft, nt Ext: no c/c/e  A/P Hypotension suspect due to morphine Tele 12 lead ekg pending Trop I q6h x3 Check cortisol Check cardiac echo Ns iv Holding parameter on morphine  Critical care time 30 minutes

## 2017-12-02 NOTE — Anesthesia Preprocedure Evaluation (Signed)
Anesthesia Evaluation  Patient identified by MRN, date of birth, ID band Patient awake    Reviewed: Allergy & Precautions, NPO status , Patient's Chart, lab work & pertinent test results  Airway Mallampati: II  TM Distance: >3 FB Neck ROM: Full    Dental no notable dental hx. (+) Upper Dentures, Lower Dentures   Pulmonary    Pulmonary exam normal breath sounds clear to auscultation       Cardiovascular hypertension, negative cardio ROS Normal cardiovascular exam Rhythm:Regular Rate:Normal     Neuro/Psych  Headaches, PSYCHIATRIC DISORDERS Bipolar Disorder    GI/Hepatic GERD  ,(+) Cirrhosis       , Hepatitis -  Endo/Other  negative endocrine ROS  Renal/GU negative Renal ROS     Musculoskeletal negative musculoskeletal ROS (+)   Abdominal   Peds  Hematology negative hematology ROS (+)   Anesthesia Other Findings   Reproductive/Obstetrics negative OB ROS                             Anesthesia Physical  Anesthesia Plan  ASA: III  Anesthesia Plan: MAC   Post-op Pain Management:    Induction: Intravenous  PONV Risk Score and Plan: 2 and Treatment may vary due to age or medical condition  Airway Management Planned: Nasal Cannula, Natural Airway and Mask  Additional Equipment:   Intra-op Plan:   Post-operative Plan:   Informed Consent: I have reviewed the patients History and Physical, chart, labs and discussed the procedure including the risks, benefits and alternatives for the proposed anesthesia with the patient or authorized representative who has indicated his/her understanding and acceptance.   Dental advisory given  Plan Discussed with: CRNA, Anesthesiologist and Surgeon  Anesthesia Plan Comments:         Anesthesia Quick Evaluation

## 2017-12-02 NOTE — Progress Notes (Addendum)
Patient complaining of numbness in her feet and hands. She verbalized that this is not something new but she would like to see a doctor here in the hospital. MD made aware.

## 2017-12-03 ENCOUNTER — Observation Stay (HOSPITAL_BASED_OUTPATIENT_CLINIC_OR_DEPARTMENT_OTHER): Payer: Medicare Other

## 2017-12-03 DIAGNOSIS — K922 Gastrointestinal hemorrhage, unspecified: Secondary | ICD-10-CM | POA: Diagnosis not present

## 2017-12-03 DIAGNOSIS — K640 First degree hemorrhoids: Secondary | ICD-10-CM | POA: Diagnosis not present

## 2017-12-03 DIAGNOSIS — I5030 Unspecified diastolic (congestive) heart failure: Secondary | ICD-10-CM

## 2017-12-03 DIAGNOSIS — R2689 Other abnormalities of gait and mobility: Secondary | ICD-10-CM | POA: Diagnosis not present

## 2017-12-03 DIAGNOSIS — D125 Benign neoplasm of sigmoid colon: Secondary | ICD-10-CM | POA: Diagnosis not present

## 2017-12-03 DIAGNOSIS — K449 Diaphragmatic hernia without obstruction or gangrene: Secondary | ICD-10-CM | POA: Diagnosis not present

## 2017-12-03 DIAGNOSIS — K635 Polyp of colon: Secondary | ICD-10-CM | POA: Diagnosis not present

## 2017-12-03 DIAGNOSIS — I10 Essential (primary) hypertension: Secondary | ICD-10-CM | POA: Diagnosis not present

## 2017-12-03 DIAGNOSIS — E876 Hypokalemia: Secondary | ICD-10-CM | POA: Diagnosis not present

## 2017-12-03 DIAGNOSIS — K5730 Diverticulosis of large intestine without perforation or abscess without bleeding: Secondary | ICD-10-CM | POA: Diagnosis not present

## 2017-12-03 DIAGNOSIS — K766 Portal hypertension: Secondary | ICD-10-CM | POA: Diagnosis not present

## 2017-12-03 DIAGNOSIS — K2900 Acute gastritis without bleeding: Secondary | ICD-10-CM | POA: Diagnosis not present

## 2017-12-03 DIAGNOSIS — Z79899 Other long term (current) drug therapy: Secondary | ICD-10-CM | POA: Diagnosis not present

## 2017-12-03 DIAGNOSIS — K3189 Other diseases of stomach and duodenum: Secondary | ICD-10-CM | POA: Diagnosis not present

## 2017-12-03 DIAGNOSIS — K921 Melena: Secondary | ICD-10-CM | POA: Diagnosis not present

## 2017-12-03 LAB — CBC WITH DIFFERENTIAL/PLATELET
Abs Immature Granulocytes: 0 10*3/uL (ref 0.0–0.1)
Basophils Absolute: 0 10*3/uL (ref 0.0–0.1)
Basophils Relative: 1 %
Eosinophils Absolute: 0.1 10*3/uL (ref 0.0–0.7)
Eosinophils Relative: 2 %
HCT: 36.9 % (ref 36.0–46.0)
Hemoglobin: 12.2 g/dL (ref 12.0–15.0)
Immature Granulocytes: 0 %
Lymphocytes Relative: 46 %
Lymphs Abs: 1.9 10*3/uL (ref 0.7–4.0)
MCH: 32.9 pg (ref 26.0–34.0)
MCHC: 33.1 g/dL (ref 30.0–36.0)
MCV: 99.5 fL (ref 78.0–100.0)
Monocytes Absolute: 0.5 10*3/uL (ref 0.1–1.0)
Monocytes Relative: 11 %
Neutro Abs: 1.7 10*3/uL (ref 1.7–7.7)
Neutrophils Relative %: 40 %
Platelets: 140 10*3/uL — ABNORMAL LOW (ref 150–400)
RBC: 3.71 MIL/uL — ABNORMAL LOW (ref 3.87–5.11)
RDW: 15.2 % (ref 11.5–15.5)
WBC: 4.2 10*3/uL (ref 4.0–10.5)

## 2017-12-03 LAB — BASIC METABOLIC PANEL
Anion gap: 13 (ref 5–15)
BUN: <5 mg/dL — ABNORMAL LOW (ref 6–20)
CO2: 20 mmol/L — ABNORMAL LOW (ref 22–32)
Calcium: 8.2 mg/dL — ABNORMAL LOW (ref 8.9–10.3)
Chloride: 103 mmol/L (ref 98–111)
Creatinine, Ser: 1.03 mg/dL — ABNORMAL HIGH (ref 0.44–1.00)
GFR calc Af Amer: >60 mL/min (ref >60)
GFR calc non Af Amer: >60 mL/min (ref >60)
Glucose, Bld: 115 mg/dL — ABNORMAL HIGH (ref 70–99)
Potassium: 3.3 mmol/L — ABNORMAL LOW (ref 3.5–5.1)
Sodium: 136 mmol/L (ref 135–145)

## 2017-12-03 LAB — MAGNESIUM: Magnesium: 1.4 mg/dL — ABNORMAL LOW (ref 1.7–2.4)

## 2017-12-03 LAB — ECHOCARDIOGRAM COMPLETE
Height: 64 in
Weight: 1964.74 oz

## 2017-12-03 LAB — TROPONIN I
Troponin I: <0.03 ng/mL (ref <0.03)
Troponin I: <0.03 ng/mL (ref <0.03)

## 2017-12-03 MED ORDER — MAGNESIUM SULFATE 2 GM/50ML IV SOLN
2.0000 g | Freq: Once | INTRAVENOUS | Status: AC
  Administered 2017-12-03: 2 g via INTRAVENOUS
  Filled 2017-12-03: qty 50

## 2017-12-03 NOTE — Progress Notes (Addendum)
PROGRESS NOTE    Jamie [name]  TMH:[id number] DOB: 04/20/1966 DOA: 12/01/2017 PCP: Helen Hashimoto., MD  Brief Narrative:52 year old female with past medical history of alcoholic and hepatitis C cirrhosis, history of IV drug abuse who presented to [facility] due to abdominal pain, rectal bleeding. Patient reported 3 episodes of bright red blood per rectum. Also complained of abdominal distention. CT abdomen/pelvis done at  did not show any acute intra-abdominal findings. Patient was transferred here for evaluation from GI.  Patient underwent EGD and colonoscopy by GI here today.  Found to have acute gastritis, portal hypertensive gastropathy, diverticulosis.  Bleeding was suspected to be from diverticular versus hemorrhoidal source.  Plan is to discharge home tomorrow if her hemoglobin is stable.  ECHO 8/10-EF 55 TO 60 PERCENT.MILD DIASTOLIC DYSFUNCTION. Assessment & Plan:   Principal Problem:   Acute GI bleeding Active Problems:   Hepatic cirrhosis (HCC)   Abdominal pain   Essential hypertension   Hypotension  Acute GI bleeding: Underwent EGD and colonoscopy by GI 8/9  Found to have acute gastritis, portal hypertensive gastropathy, diverticulosis.  No active bleeding seen.Bleeding was suspected to be from diverticular versus hemorrhoidal source.  Bleeding has stopped.  Hemoglobin stable  Diffuse abdominal pain: Currently abdomen pain has much improved.  CT abdomen/pelvis done at [facility] showed resolved colitis.  History of hypertension: We will resume her home medications..  We will continue to monitor her blood pressure. ADDENDUM 505 PM DCD COREG DUE TO LOW BP.  History of cirrhosis/portal hypertension: Continue beta-blockers and diuretics.  Follows with GI as an outpatient  History of IV drug abuse: States that she has stopped using IV drugs about 5 months ago.  Bilateral lower extremity tingling/numbness: Continue gabapentin  Hypokalemia her potassium  was 3.0 yesterday I do not have a repeat from today we will recheck levels today and tomorrow.    DVT prophylaxis SCD Code Status: Full code Family Communication: None Disposition Plan: Plan to discharge her home tomorrow 12/04/2017.  Patient reports that she is very unstable on her feet still waiting for physical therapy to assist her.  She is lightheaded and unstable gait.  She lives alone and she is afraid to go home today.  Her parents are coming home tomorrow and they will take her home tomorrow so she does not have to be alone.  She probably will need home health PT arranged after physical therapy see her.  Tell her that she should be able to be discharged home 12/04/2017 after PT see her along with her family.  Consultants: GI   Procedures: EGD and colonoscopy Antimicrobials none Subjective: Ports that she is very unstable on her feet has not walked in the room waiting for physical therapy to see her.  She lives alone and she is very concerned about going home today as she feels she is going to fall as she has had many falls in the past.  She feels weak have not gotten out of bed much has not ambulated much and has not had much to eat and she feels unsafe to go home today.  Objective: Vitals:   12/02/17 2010 12/02/17 2131 12/03/17 0537 12/03/17 0829  BP: 114/73 117/84 135/84 135/73  Pulse:  80 79 79  Resp:  18 18   Temp:  98.5 F (36.9 C)    TempSrc:  Oral    SpO2:  99% 99%   Weight:      Height:        Intake/Output Summary (  Last 24 hours) at 12/03/2017 0925 Last data filed at 12/02/2017 2100 Gross per 24 hour  Intake 1809.59 ml  Output 650 ml  Net 1159.59 ml   Filed Weights   12/01/17 0300  Weight: 55.7 kg    Examination:  General exam: Appears calm and comfortable  Respiratory system: Clear to auscultation. Respiratory effort normal. Cardiovascular system: S1 & S2 heard, RRR. No JVD, murmurs, rubs, gallops or clicks. No pedal edema. Gastrointestinal system:  Abdomen is Distended, soft and nontender. No organomegaly or masses felt. Normal bowel sounds heard. Central nervous system: Alert and oriented. No focal neurological deficits. Extremities: Symmetric 5 x 5 power. Skin: No rashes, lesions or ulcers Psychiatry: Judgement and insight appear normal. Mood & affect appropriate.     Data Reviewed: I have personally reviewed following labs and imaging studies  CBC: Recent Labs  Lab 12/01/17 0819 12/01/17 1137 12/02/17 0553 12/02/17 1959 12/03/17 0348  WBC 4.5 3.6* 3.6* 4.1 4.2  NEUTROABS  --   --  1.9  --  1.7  HGB 12.7 10.7* 12.0 12.1 12.2  HCT 38.5 33.1* 36.9 37.1 36.9  MCV 99.0 99.7 99.7 99.2 99.5  PLT 147* 118* 132* 139* 998*   Basic Metabolic Panel: Recent Labs  Lab 12/01/17 0439 12/02/17 0553 12/02/17 1959  NA 134* 137 134*  K 3.8 3.9 3.0*  CL 101 106 105  CO2 24 21* 20*  GLUCOSE 81 81 113*  BUN <5* <5* <5*  CREATININE 0.85 0.84 0.97  CALCIUM 8.2* 8.0* 7.4*   GFR: Estimated Creatinine Clearance: 58.6 mL/min (by C-G formula based on SCr of 0.97 mg/dL). Liver Function Tests: Recent Labs  Lab 12/01/17 0439 12/02/17 1959  AST 76* 48*  ALT 38 27  ALKPHOS 222* 174*  BILITOT 1.2 0.7  PROT 6.1* 5.9*  ALBUMIN 2.4* 2.5*   No results for input(s): LIPASE, AMYLASE in the last 168 hours. No results for input(s): AMMONIA in the last 168 hours. Coagulation Profile: Recent Labs  Lab 12/01/17 0439  INR 1.12   Cardiac Enzymes: Recent Labs  Lab 12/02/17 1959 12/03/17 0348  TROPONINI <0.03 <0.03   BNP (last 3 results) No results for input(s): PROBNP in the last 8760 hours. HbA1C: No results for input(s): HGBA1C in the last 72 hours. CBG: Recent Labs  Lab 12/02/17 1829  GLUCAP 103*   Lipid Profile: No results for input(s): CHOL, HDL, LDLCALC, TRIG, CHOLHDL, LDLDIRECT in the last 72 hours. Thyroid Function Tests: No results for input(s): TSH, T4TOTAL, FREET4, T3FREE, THYROIDAB in the last 72 hours. Anemia  Panel: No results for input(s): VITAMINB12, FOLATE, FERRITIN, TIBC, IRON, RETICCTPCT in the last 72 hours. Sepsis Labs: No results for input(s): PROCALCITON, LATICACIDVEN in the last 168 hours.  No results found for this or any previous visit (from the past 240 hour(s)).       Radiology Studies: No results found.      Scheduled Meds: . carvedilol  12.5 mg Oral BID WC  . feeding supplement  1 Container Oral TID BM  . furosemide  40 mg Oral Daily  . gabapentin  300 mg Oral BID  . pantoprazole  40 mg Oral Daily   Continuous Infusions: . sodium chloride 10 mL/hr at 12/02/17 0546     LOS: 0 days     Georgette Shell, MD [facility]  If 7PM-7AM, please contact night-coverage [email or web address] Password [email or web address] 12/03/2017, 9:25 AM
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
## 2017-12-03 NOTE — Progress Notes (Signed)
Patient ID: Jamie [name], female   DOB: 08/15/1965, 52 y.o.   MRN: [id number]  Feels ok. Bedside echo just completed. Denies abdominal pain. Hypotensive episode yesterday.  Hgb 12.2.  Suspect she had a diverticular bleed that resolved. Low sodium diet due to cirrhosis. No further GI recs. Will sign off. Call if questions. F/U with Dr. Paulita Fujita in 3-4 weeks.

## 2017-12-03 NOTE — Progress Notes (Signed)
  Echocardiogram 2D Echocardiogram has been performed.  Jamie [name] 12/03/2017, 10:16 AM

## 2017-12-04 DIAGNOSIS — K2900 Acute gastritis without bleeding: Secondary | ICD-10-CM | POA: Diagnosis not present

## 2017-12-04 DIAGNOSIS — K766 Portal hypertension: Secondary | ICD-10-CM | POA: Diagnosis not present

## 2017-12-04 DIAGNOSIS — I10 Essential (primary) hypertension: Secondary | ICD-10-CM | POA: Diagnosis not present

## 2017-12-04 DIAGNOSIS — K922 Gastrointestinal hemorrhage, unspecified: Secondary | ICD-10-CM

## 2017-12-04 DIAGNOSIS — K3189 Other diseases of stomach and duodenum: Secondary | ICD-10-CM | POA: Diagnosis not present

## 2017-12-04 DIAGNOSIS — E876 Hypokalemia: Secondary | ICD-10-CM | POA: Diagnosis not present

## 2017-12-04 DIAGNOSIS — I959 Hypotension, unspecified: Secondary | ICD-10-CM | POA: Diagnosis not present

## 2017-12-04 DIAGNOSIS — K640 First degree hemorrhoids: Secondary | ICD-10-CM | POA: Diagnosis not present

## 2017-12-04 DIAGNOSIS — K449 Diaphragmatic hernia without obstruction or gangrene: Secondary | ICD-10-CM | POA: Diagnosis not present

## 2017-12-04 DIAGNOSIS — K635 Polyp of colon: Secondary | ICD-10-CM | POA: Diagnosis not present

## 2017-12-04 DIAGNOSIS — Z79899 Other long term (current) drug therapy: Secondary | ICD-10-CM | POA: Diagnosis not present

## 2017-12-04 DIAGNOSIS — K5730 Diverticulosis of large intestine without perforation or abscess without bleeding: Secondary | ICD-10-CM | POA: Diagnosis not present

## 2017-12-04 DIAGNOSIS — J189 Pneumonia, unspecified organism: Secondary | ICD-10-CM | POA: Diagnosis not present

## 2017-12-04 DIAGNOSIS — J40 Bronchitis, not specified as acute or chronic: Secondary | ICD-10-CM | POA: Diagnosis not present

## 2017-12-04 DIAGNOSIS — R2689 Other abnormalities of gait and mobility: Secondary | ICD-10-CM | POA: Diagnosis not present

## 2017-12-04 LAB — CBC WITH DIFFERENTIAL/PLATELET
Abs Immature Granulocytes: 0 10*3/uL (ref 0.0–0.1)
Basophils Absolute: 0.1 10*3/uL (ref 0.0–0.1)
Basophils Relative: 1 %
Eosinophils Absolute: 0.2 10*3/uL (ref 0.0–0.7)
Eosinophils Relative: 2 %
HCT: 44.1 % (ref 36.0–46.0)
Hemoglobin: 14.4 g/dL (ref 12.0–15.0)
Immature Granulocytes: 0 %
Lymphocytes Relative: 30 %
Lymphs Abs: 2.4 10*3/uL (ref 0.7–4.0)
MCH: 32.1 pg (ref 26.0–34.0)
MCHC: 32.7 g/dL (ref 30.0–36.0)
MCV: 98.4 fL (ref 78.0–100.0)
Monocytes Absolute: 0.8 10*3/uL (ref 0.1–1.0)
Monocytes Relative: 10 %
Neutro Abs: 4.6 10*3/uL (ref 1.7–7.7)
Neutrophils Relative %: 57 %
Platelets: 159 10*3/uL (ref 150–400)
RBC: 4.48 MIL/uL (ref 3.87–5.11)
RDW: 15.5 % (ref 11.5–15.5)
WBC: 8 10*3/uL (ref 4.0–10.5)

## 2017-12-04 LAB — POTASSIUM: Potassium: 4.4 mmol/L (ref 3.5–5.1)

## 2017-12-04 LAB — BASIC METABOLIC PANEL
Anion gap: 12 (ref 5–15)
BUN: <5 mg/dL — ABNORMAL LOW (ref 6–20)
CO2: 23 mmol/L (ref 22–32)
Calcium: 8.4 mg/dL — ABNORMAL LOW (ref 8.9–10.3)
Chloride: 102 mmol/L (ref 98–111)
Creatinine, Ser: 0.97 mg/dL (ref 0.44–1.00)
GFR calc Af Amer: >60 mL/min (ref >60)
GFR calc non Af Amer: >60 mL/min (ref >60)
Glucose, Bld: 51 mg/dL — ABNORMAL LOW (ref 70–99)
Potassium: 2.9 mmol/L — ABNORMAL LOW (ref 3.5–5.1)
Sodium: 137 mmol/L (ref 135–145)

## 2017-12-04 LAB — MAGNESIUM: Magnesium: 1.9 mg/dL (ref 1.7–2.4)

## 2017-12-04 MED ORDER — OXYCODONE-ACETAMINOPHEN 5-325 MG PO TABS
1.0000 | ORAL_TABLET | Freq: Once | ORAL | Status: AC
  Administered 2017-12-04: 1 via ORAL
  Filled 2017-12-04: qty 1

## 2017-12-04 MED ORDER — OXYCODONE HCL 5 MG PO TABS: 5.0000 mg | ORAL_TABLET | Freq: Three times a day (TID) | ORAL | 0 refills | Status: DC | PRN

## 2017-12-04 MED ORDER — POTASSIUM CHLORIDE CRYS ER 20 MEQ PO TBCR
40.0000 meq | EXTENDED_RELEASE_TABLET | ORAL | Status: AC
  Administered 2017-12-04 (×2): 40 meq via ORAL
  Filled 2017-12-04 (×2): qty 2

## 2017-12-04 MED ORDER — POTASSIUM CHLORIDE 10 MEQ/100ML IV SOLN
10.0000 meq | Freq: Once | INTRAVENOUS | Status: AC
  Administered 2017-12-04: 10 meq via INTRAVENOUS
  Filled 2017-12-04: qty 100

## 2017-12-04 NOTE — Anesthesia Postprocedure Evaluation (Signed)
Anesthesia Post Note  Patient: Jamie [name]  Procedure(s) Performed: COLONOSCOPY WITH PROPOFOL (N/A ) ESOPHAGOGASTRODUODENOSCOPY (EGD) WITH PROPOFOL (N/A ) POLYPECTOMY BIOPSY     Patient location during evaluation: PACU Anesthesia Type: MAC Level of consciousness: awake and alert Pain management: pain level controlled Vital Signs Assessment: post-procedure vital signs reviewed and stable Respiratory status: spontaneous breathing, nonlabored ventilation, respiratory function stable and patient connected to nasal cannula oxygen Cardiovascular status: stable and blood pressure returned to baseline Postop Assessment: no apparent nausea or vomiting Anesthetic complications: no    Last Vitals:  Vitals:   12/04/17 0415 12/04/17 0912  BP: 120/64 (!) 146/90  Pulse: 85 94  Resp: 16   Temp: 37.1 C   SpO2: 99%     Last Pain:  Vitals:   12/04/17 1010  TempSrc:   PainSc: 6                  Marabelle Cushman

## 2017-12-04 NOTE — Discharge Instructions (Addendum)
Please follow up with your primary care provider in 1 week to check your blood count, electrolytes and blood pressure. Your primary care provider will tell you when to restart your blood pressure medications.

## 2017-12-04 NOTE — Anesthesia Postprocedure Evaluation (Signed)
Anesthesia Post Note  Patient: [name]  Procedure(s) Performed: COLONOSCOPY WITH PROPOFOL (N/A ) ESOPHAGOGASTRODUODENOSCOPY (EGD) WITH PROPOFOL (N/A ) POLYPECTOMY BIOPSY     Patient location during evaluation: PACU Anesthesia Type: MAC Level of consciousness: awake and alert Pain management: pain level controlled Vital Signs Assessment: post-procedure vital signs reviewed and stable Respiratory status: spontaneous breathing, nonlabored ventilation, respiratory function stable and patient connected to nasal cannula oxygen Cardiovascular status: stable and blood pressure returned to baseline Postop Assessment: no apparent nausea or vomiting Anesthetic complications: no    Last Vitals:  Vitals:   12/04/17 0415 12/04/17 0912  BP: 120/64 (!) 146/90  Pulse: 85 94  Resp: 16   Temp: 37.1 C   SpO2: 99%     Last Pain:  Vitals:   12/04/17 1010  TempSrc:   PainSc: 6                  Jamie [name]     

## 2017-12-04 NOTE — Progress Notes (Signed)
Jamie [name] to be D/C'd home per MD order. Discussed with the patient and parents and all questions fully answered. VVS, Skin clean, dry and intact without evidence of skin break down, no evidence of skin tears noted.  IV catheter discontinued intact. Site without signs and symptoms of complications. Dressing and pressure applied.  An After Visit Summary and prescriptions were printed and given to the patient.  Patient escorted via [place], and D/C home via private auto.  [name]  12/04/2017 5:41 PM

## 2017-12-04 NOTE — Care Management Obs Status (Signed)
[place] NOTIFICATION   Patient Details  Name: TEYONNA PLAISTED MRN: [id number] Date of Birth: Jun 07, 1965   Medicare Observation Status Notification Given:  Yes    Pollie Friar, RN 12/04/2017, 12:11 PM

## 2017-12-04 NOTE — Addendum Note (Signed)
Addendum  created 12/04/17 1313 by Janeece Riggers, MD   Sign clinical note

## 2017-12-04 NOTE — Discharge Summary (Signed)
Physician Discharge Summary  Jamie [name] [facility]:[id number] DOB: 1965/07/27 DOA: 12/01/2017  PCP: Helen Hashimoto., MD  Admit date: 12/01/2017 Discharge date: 12/04/2017  Admitted From: Home Disposition: Home  Recommendations for Outpatient Follow-up:  1. Follow up with PCP in 1-2 weeks 2. Please obtain BMP/CBC in one week 3. Please follow up on the following pending results:  Home Health: yes Equipment/Devices:  5" wheels 3 in 1,   Discharge Condition: Stable CODE STATUS: Full Diet recommendation:    HPI on admission by Dr. Hal Hope-   Chief Complaint: Rectal bleeding.  HPI: Jamie [name] is a 52 y.o. female with history of liver cirrhosis likely from alcoholism and history of hepatitis C, hypertension bipolar disorder IV drug abuse had gone to [facility] after patient noticed rectal bleeding yesterday.  Patient states last 2 days patient has been having diffuse abdominal pain unable to eat anything.  Yesterday patient noticed while sitting on the commode she had frank rectal bleeding and had happened 4 times.  Abdominal pain is diffuse mostly in the lower abdomen and epigastric.  Denies any fever chills.  Use NSAIDs for pain.  History of IV drug abuse but patient states last time she used drugs was in February 2019 5 months ago.  At [facility] CT abdomen and pelvis did not show anything acute.  Hemoglobin was around 14.  Since patient will require GI patient transferred to [facility].  Discharge Diagnoses:  Principal Problem:   Acute GI bleeding Active Problems:   Hepatic cirrhosis (HCC)   Abdominal pain   Essential hypertension   Hypotension  Acute GI bleeding: Underwent EGD and colonoscopy by GI 8/9 Found to have acute gastritis, portal hypertensive gastropathy,diverticulosis. No active bleeding seen. Bleeding was suspected to be from diverticular versus hemorrhoidal source.Bleeding has stopped.  Hemoglobin stable.  Colonoscopy- One 7 mm  polyp in the sigmoid colon, removed with a hot snare. Resected and retrieved. - One 5 mm polyp in the sigmoid colon, removed with a cold biopsy forceps. Resected and Retrieved. - Diverticulosis in the sigmoid colon, Internal hemorrhoids.  -  Hemoglobin stable at 12, on discharge, follow-up with PCP for CBC check in 1 week. -GI recommended,  f/u With Dr. Paulita Fujita in 3- 4 weeks.   Diffuse abdominal pain: Currently abdomen pain improved. CT abdomen/pelvis done at [facility] showed resolved colitis. oxycodone 5 mg q. 6 hourly # 10 pills.  Avoiding NSAIDs due to GI bleed  HTN- Home blood pressure medications Coreg held due to low soft blood pressure.  Patient to follow-up with PCP before resumption of Coreg.  History of cirrhosis/portal hypertension: Reports she is not on Lasix or p.o. potassium pills.  Low up with PCP for BMP check in 1 week  History of IV drug abuse: Statesthat shehas stopped using IV drugs about 5 months ago.  Bilateral lower extremity tingling/numbness: Continue gabapentin  Hypokalemia- repleted, potassium 4.4 on discharge   Discharge Instructions   Allergies as of 12/04/2017   No Known Allergies     Medication List    STOP taking these medications   carvedilol 12.5 MG tablet Commonly known as:  COREG   diazepam 5 MG tablet Commonly known as:  VALIUM   furosemide 40 MG tablet Commonly known as:  LASIX   naproxen sodium 220 MG tablet Commonly known as:  ALEVE   ondansetron 4 MG disintegrating tablet Commonly known as:  ZOFRAN-ODT   potassium chloride SA 20 MEQ tablet Commonly known as:  K-DUR,KLOR-CON  TAKE these medications   albuterol 108 (90 Base) MCG/ACT inhaler Commonly known as:  PROVENTIL HFA;VENTOLIN HFA Inhale 2 puffs into the lungs every 6 (six) hours as needed for wheezing or shortness of breath.   cyclobenzaprine 10 MG tablet Commonly known as:  FLEXERIL Take 10 mg by mouth 2 (two) times daily as needed for pain.    gabapentin 300 MG capsule Commonly known as:  NEURONTIN Take 1 capsule (300 mg total) by mouth 2 (two) times daily.   GINGER PO Take 1 capsule by mouth daily.   Milk Thistle 250 MG Caps Take 250 mg by mouth daily.   omeprazole 40 MG capsule Commonly known as:  PRILOSEC Take 1 capsule (40 mg total) by mouth daily.   oxyCODONE 5 MG immediate release tablet Commonly known as:  Oxy IR/ROXICODONE Take 1 tablet (5 mg total) by mouth every 8 (eight) hours as needed for severe pain.            Durable Medical Equipment  (From admission, onward)         Start     Ordered   12/04/17 1311  For home use only DME Shower stool  Once     12/04/17 1310   12/04/17 1142  For home use only DME 3 n 1  Once     12/04/17 1142   12/04/17 1142  For home use only DME Walker rolling  Once    Question:  Patient needs a walker to treat with the following condition  Answer:  Generalized weakness   12/04/17 1142          No Known Allergies  Consultations:  GI-    Procedures/Studies:  Echocardiogram- 12/03/17- Left ventricle: The cavity size was normal. Wall thickness was   normal. Systolic function was normal. The estimated ejection   fraction was in the range of 55% to 60%. Wall motion was normal;   there were no regional wall motion abnormalities. Doppler   parameters are consistent with abnormal left ventricular   relaxation (grade 1 diastolic dysfunction  11/01/13 EGD-  The examined esophagus was normal. Findings: The Z-line was regular and was found 38 cm from the incisors. Segmental mild inflammation characterized by congestion (edema) was found in the gastric antrum. A small hiatal hernia was present. Mild portal hypertensive gastropathy was found in the gastric body. The examined duodenum was normal. - Normal esophagus. - Z-line regular, 38 cm from the incisors. - Acute gastritis. - Small hiatal hernia. - Portal hypertensive gastropathy. - Normal examined  duodenum.  Colonoscopy- - One 7 mm polyp in the sigmoid colon, removed with a hot snare. Resected and retrieved. - One 5 mm polyp in the sigmoid colon, removed with a cold biopsy forceps. Resected and retrieved. - Diverticulosis in the sigmoid colon. - Internal hemorrhoids. - The examined portion of the ileum was normal.  Subjective: No GI blood loss since admission.  Episode of hypotension yesterday, improved with IV fluids bolus.  Blood pressure stable off IV fluids 24 hours.  Reports ongoing abdominal pain improved since admission.  Reports tramadol does not help with pain.   Discharge Exam: Vitals:   12/04/17 0912 12/04/17 1428  BP: (!) 146/90 127/67  Pulse: 94 99  Resp:  17  Temp:  98.4 F (36.9 C)  SpO2:  98%   Vitals:   12/04/17 0415 12/04/17 0500 12/04/17 0912 12/04/17 1428  BP: 120/64  (!) 146/90 127/67  Pulse: 85  94 99  Resp: 16  17  Temp: 98.7 F (37.1 C)   98.4 F (36.9 C)  TempSrc: Oral   Oral  SpO2: 99%   98%  Weight:  57.9 kg    Height:        General: Pt is alert, awake, not in acute distress Cardiovascular: RRR, S1/S2 +, no rubs, no gallops Respiratory: CTA bilaterally, no wheezing, no rhonchi Abdominal: Soft, NT, ND, bowel sounds + Extremities: no edema, no cyanosis    The results of significant diagnostics from this hospitalization (including imaging, microbiology, ancillary and laboratory) are listed below for reference.     Microbiology: No results found for this or any previous visit (from the past 240 hour(s)).   Labs: BNP (last 3 results) No results for input(s): BNP in the last 8760 hours. Basic Metabolic Panel: Recent Labs  Lab 12/01/17 0439 12/02/17 0553 12/02/17 1959 12/03/17 1014 12/04/17 0647 12/04/17 0922 12/04/17 1329  NA 134* 137 134* 136 137  --   --   K 3.8 3.9 3.0* 3.3* 2.9*  --  4.4  CL 101 106 105 103 102  --   --   CO2 24 21* 20* 20* 23  --   --   GLUCOSE 81 81 113* 115* 51*  --   --   BUN <5* <5* <5* <5* <5*   --   --   CREATININE 0.85 0.84 0.97 1.03* 0.97  --   --   CALCIUM 8.2* 8.0* 7.4* 8.2* 8.4*  --   --   MG  --   --   --  1.4*  --  1.9  --    Liver Function Tests: Recent Labs  Lab 12/01/17 0439 12/02/17 1959  AST 76* 48*  ALT 38 27  ALKPHOS 222* 174*  BILITOT 1.2 0.7  PROT 6.1* 5.9*  ALBUMIN 2.4* 2.5*   CBC: Recent Labs  Lab 12/01/17 1137 12/02/17 0553 12/02/17 1959 12/03/17 0348 12/04/17 0922  WBC 3.6* 3.6* 4.1 4.2 8.0  NEUTROABS  --  1.9  --  1.7 4.6  HGB 10.7* 12.0 12.1 12.2 14.4  HCT 33.1* 36.9 37.1 36.9 44.1  MCV 99.7 99.7 99.2 99.5 98.4  PLT 118* 132* 139* 140* 159   Cardiac Enzymes: Recent Labs  Lab 12/02/17 1959 12/03/17 0348 12/03/17 1014  TROPONINI <0.03 <0.03 <0.03   BNP: Invalid input(s): POCBNP CBG: Recent Labs  Lab 12/02/17 1829  GLUCAP 103*   Urinalysis    Component Value Date/Time   COLORURINE COLORLESS (A) 12/02/2017 1737   APPEARANCEUR CLEAR 12/02/2017 1737   LABSPEC 1.003 (L) 12/02/2017 1737   PHURINE 6.0 12/02/2017 1737   GLUCOSEU NEGATIVE 12/02/2017 1737   HGBUR NEGATIVE 12/02/2017 1737   BILIRUBINUR NEGATIVE 12/02/2017 1737   KETONESUR NEGATIVE 12/02/2017 1737   PROTEINUR NEGATIVE 12/02/2017 1737   UROBILINOGEN 0.2 06/23/2011 0640   NITRITE NEGATIVE 12/02/2017 1737   LEUKOCYTESUR NEGATIVE 12/02/2017 1737     Time coordinating discharge: Over 30 minutes  SIGNED:   Bethena Roys, MD  [facility] 12/04/2017, 2:59 PM  If 7PM-7AM, please contact night-coverage [email or web address] Password [email or web address]

## 2017-12-04 NOTE — Care Management Note (Signed)
Case Management Note  Patient Details  Name: Jamie [name] MRN: [id number] Date of Birth: 1966-03-03  Subjective/Objective:                    Action/Plan: Plan is for patient to d/c to her parents home: [facility] in [place]. Her parents number is : [phone] Pt requesting [place] over outpatient for the ease of her parents. CM provided her choice and she selected [facility]. Jermaine with [facility] notified and accepted the referral.  Pt with orders for walker, 3 in 1. Jermaine made aware and delivered the DME to the room. Pt doesn't feel the 3 in 1 will fit in the shower. She is requesting a shower stool. Jermaine with [facility] aware and will deliver to the room.  Parents to provide supervision and support at home and transportation to home.   Expected Discharge Date:                  Expected Discharge Plan:  [place]  In-House Referral:     Discharge planning Services  CM Consult  Post Acute Care Choice:  Home Health, Durable Medical Equipment Choice offered to:  Patient  DME Arranged:  3-N-1, Shower stool, Walker rolling DME Agency:  [place]:  PT, OT [facility] Agency:  [place]  Status of Service:  Completed, signed off  If discussed at [place] of Stay Meetings, dates discussed:    Additional Comments:  Pollie Friar, RN 12/04/2017, 1:23 PM

## 2017-12-07 DIAGNOSIS — K449 Diaphragmatic hernia without obstruction or gangrene: Secondary | ICD-10-CM | POA: Diagnosis not present

## 2017-12-07 DIAGNOSIS — R1310 Dysphagia, unspecified: Secondary | ICD-10-CM | POA: Diagnosis not present

## 2017-12-07 DIAGNOSIS — Z79891 Long term (current) use of opiate analgesic: Secondary | ICD-10-CM | POA: Diagnosis not present

## 2017-12-07 DIAGNOSIS — K3189 Other diseases of stomach and duodenum: Secondary | ICD-10-CM | POA: Diagnosis not present

## 2017-12-07 DIAGNOSIS — I10 Essential (primary) hypertension: Secondary | ICD-10-CM | POA: Diagnosis not present

## 2017-12-07 DIAGNOSIS — R296 Repeated falls: Secondary | ICD-10-CM | POA: Diagnosis not present

## 2017-12-07 DIAGNOSIS — R1084 Generalized abdominal pain: Secondary | ICD-10-CM | POA: Diagnosis not present

## 2017-12-07 DIAGNOSIS — S32000A Wedge compression fracture of unspecified lumbar vertebra, initial encounter for closed fracture: Secondary | ICD-10-CM | POA: Diagnosis not present

## 2017-12-07 DIAGNOSIS — Z8719 Personal history of other diseases of the digestive system: Secondary | ICD-10-CM | POA: Diagnosis not present

## 2017-12-07 DIAGNOSIS — D62 Acute posthemorrhagic anemia: Secondary | ICD-10-CM | POA: Diagnosis not present

## 2017-12-07 DIAGNOSIS — K5730 Diverticulosis of large intestine without perforation or abscess without bleeding: Secondary | ICD-10-CM | POA: Diagnosis not present

## 2017-12-07 DIAGNOSIS — K648 Other hemorrhoids: Secondary | ICD-10-CM | POA: Diagnosis not present

## 2017-12-07 DIAGNOSIS — K219 Gastro-esophageal reflux disease without esophagitis: Secondary | ICD-10-CM | POA: Diagnosis not present

## 2017-12-07 DIAGNOSIS — K766 Portal hypertension: Secondary | ICD-10-CM | POA: Diagnosis not present

## 2017-12-07 DIAGNOSIS — K2900 Acute gastritis without bleeding: Secondary | ICD-10-CM | POA: Diagnosis not present

## 2017-12-12 DIAGNOSIS — Z79891 Long term (current) use of opiate analgesic: Secondary | ICD-10-CM | POA: Diagnosis not present

## 2017-12-12 DIAGNOSIS — R1084 Generalized abdominal pain: Secondary | ICD-10-CM | POA: Diagnosis not present

## 2017-12-12 DIAGNOSIS — K766 Portal hypertension: Secondary | ICD-10-CM | POA: Diagnosis not present

## 2017-12-12 DIAGNOSIS — K2900 Acute gastritis without bleeding: Secondary | ICD-10-CM | POA: Diagnosis not present

## 2017-12-12 DIAGNOSIS — K5730 Diverticulosis of large intestine without perforation or abscess without bleeding: Secondary | ICD-10-CM | POA: Diagnosis not present

## 2017-12-12 DIAGNOSIS — K449 Diaphragmatic hernia without obstruction or gangrene: Secondary | ICD-10-CM | POA: Diagnosis not present

## 2017-12-12 DIAGNOSIS — K648 Other hemorrhoids: Secondary | ICD-10-CM | POA: Diagnosis not present

## 2017-12-12 DIAGNOSIS — I10 Essential (primary) hypertension: Secondary | ICD-10-CM | POA: Diagnosis not present

## 2017-12-12 DIAGNOSIS — Z8719 Personal history of other diseases of the digestive system: Secondary | ICD-10-CM | POA: Diagnosis not present

## 2017-12-12 DIAGNOSIS — D62 Acute posthemorrhagic anemia: Secondary | ICD-10-CM | POA: Diagnosis not present

## 2017-12-12 DIAGNOSIS — K3189 Other diseases of stomach and duodenum: Secondary | ICD-10-CM | POA: Diagnosis not present

## 2017-12-12 DIAGNOSIS — K219 Gastro-esophageal reflux disease without esophagitis: Secondary | ICD-10-CM | POA: Diagnosis not present

## 2017-12-12 DIAGNOSIS — R1310 Dysphagia, unspecified: Secondary | ICD-10-CM | POA: Diagnosis not present

## 2017-12-12 DIAGNOSIS — R296 Repeated falls: Secondary | ICD-10-CM | POA: Diagnosis not present

## 2017-12-13 DIAGNOSIS — R1011 Right upper quadrant pain: Secondary | ICD-10-CM | POA: Diagnosis not present

## 2017-12-13 DIAGNOSIS — R1032 Left lower quadrant pain: Secondary | ICD-10-CM | POA: Diagnosis not present

## 2017-12-13 DIAGNOSIS — R079 Chest pain, unspecified: Secondary | ICD-10-CM | POA: Diagnosis not present

## 2017-12-13 DIAGNOSIS — R000 Tachycardia, unspecified: Secondary | ICD-10-CM | POA: Diagnosis not present

## 2017-12-13 DIAGNOSIS — K625 Hemorrhage of anus and rectum: Secondary | ICD-10-CM | POA: Diagnosis not present

## 2017-12-13 DIAGNOSIS — Z09 Encounter for follow-up examination after completed treatment for conditions other than malignant neoplasm: Secondary | ICD-10-CM | POA: Diagnosis not present

## 2017-12-13 DIAGNOSIS — R7989 Other specified abnormal findings of blood chemistry: Secondary | ICD-10-CM | POA: Diagnosis not present

## 2017-12-13 DIAGNOSIS — K7460 Unspecified cirrhosis of liver: Secondary | ICD-10-CM | POA: Diagnosis not present

## 2017-12-13 DIAGNOSIS — Z6822 Body mass index (BMI) 22.0-22.9, adult: Secondary | ICD-10-CM | POA: Diagnosis not present

## 2017-12-14 DIAGNOSIS — Z9181 History of falling: Secondary | ICD-10-CM | POA: Diagnosis not present

## 2017-12-14 DIAGNOSIS — R42 Dizziness and giddiness: Secondary | ICD-10-CM | POA: Diagnosis not present

## 2017-12-14 DIAGNOSIS — K760 Fatty (change of) liver, not elsewhere classified: Secondary | ICD-10-CM | POA: Diagnosis not present

## 2017-12-14 DIAGNOSIS — E538 Deficiency of other specified B group vitamins: Secondary | ICD-10-CM | POA: Diagnosis not present

## 2017-12-14 DIAGNOSIS — R740 Nonspecific elevation of levels of transaminase and lactic acid dehydrogenase [LDH]: Secondary | ICD-10-CM | POA: Diagnosis not present

## 2017-12-14 DIAGNOSIS — Z79899 Other long term (current) drug therapy: Secondary | ICD-10-CM | POA: Diagnosis not present

## 2017-12-14 DIAGNOSIS — K3189 Other diseases of stomach and duodenum: Secondary | ICD-10-CM | POA: Diagnosis not present

## 2017-12-14 DIAGNOSIS — R109 Unspecified abdominal pain: Secondary | ICD-10-CM | POA: Diagnosis not present

## 2017-12-14 DIAGNOSIS — G8929 Other chronic pain: Secondary | ICD-10-CM | POA: Diagnosis not present

## 2017-12-14 DIAGNOSIS — I10 Essential (primary) hypertension: Secondary | ICD-10-CM | POA: Diagnosis not present

## 2017-12-14 DIAGNOSIS — Z79891 Long term (current) use of opiate analgesic: Secondary | ICD-10-CM | POA: Diagnosis not present

## 2017-12-14 DIAGNOSIS — R262 Difficulty in walking, not elsewhere classified: Secondary | ICD-10-CM | POA: Diagnosis not present

## 2017-12-14 DIAGNOSIS — Z6821 Body mass index (BMI) 21.0-21.9, adult: Secondary | ICD-10-CM | POA: Diagnosis not present

## 2017-12-14 DIAGNOSIS — R7989 Other specified abnormal findings of blood chemistry: Secondary | ICD-10-CM | POA: Diagnosis not present

## 2017-12-14 DIAGNOSIS — E872 Acidosis: Secondary | ICD-10-CM | POA: Diagnosis not present

## 2017-12-14 DIAGNOSIS — E440 Moderate protein-calorie malnutrition: Secondary | ICD-10-CM | POA: Diagnosis not present

## 2017-12-14 DIAGNOSIS — E039 Hypothyroidism, unspecified: Secondary | ICD-10-CM | POA: Diagnosis not present

## 2017-12-14 DIAGNOSIS — F1010 Alcohol abuse, uncomplicated: Secondary | ICD-10-CM

## 2017-12-14 DIAGNOSIS — R5381 Other malaise: Secondary | ICD-10-CM | POA: Diagnosis not present

## 2017-12-14 DIAGNOSIS — E860 Dehydration: Secondary | ICD-10-CM | POA: Diagnosis not present

## 2017-12-14 DIAGNOSIS — K2970 Gastritis, unspecified, without bleeding: Secondary | ICD-10-CM | POA: Diagnosis not present

## 2017-12-14 DIAGNOSIS — K219 Gastro-esophageal reflux disease without esophagitis: Secondary | ICD-10-CM | POA: Diagnosis not present

## 2017-12-14 DIAGNOSIS — R000 Tachycardia, unspecified: Secondary | ICD-10-CM | POA: Diagnosis not present

## 2017-12-14 DIAGNOSIS — Z765 Malingerer [conscious simulation]: Secondary | ICD-10-CM | POA: Diagnosis not present

## 2017-12-14 DIAGNOSIS — R079 Chest pain, unspecified: Secondary | ICD-10-CM | POA: Diagnosis not present

## 2017-12-14 DIAGNOSIS — K766 Portal hypertension: Secondary | ICD-10-CM | POA: Diagnosis not present

## 2017-12-14 DIAGNOSIS — R1032 Left lower quadrant pain: Secondary | ICD-10-CM | POA: Diagnosis not present

## 2017-12-14 DIAGNOSIS — R1011 Right upper quadrant pain: Secondary | ICD-10-CM | POA: Diagnosis not present

## 2017-12-14 DIAGNOSIS — G4700 Insomnia, unspecified: Secondary | ICD-10-CM | POA: Diagnosis not present

## 2017-12-16 DIAGNOSIS — R42 Dizziness and giddiness: Secondary | ICD-10-CM | POA: Diagnosis not present

## 2017-12-16 DIAGNOSIS — E039 Hypothyroidism, unspecified: Secondary | ICD-10-CM | POA: Diagnosis not present

## 2017-12-16 DIAGNOSIS — K5900 Constipation, unspecified: Secondary | ICD-10-CM | POA: Diagnosis not present

## 2017-12-16 DIAGNOSIS — R100 Acute abdomen: Secondary | ICD-10-CM | POA: Diagnosis not present

## 2017-12-16 DIAGNOSIS — R000 Tachycardia, unspecified: Secondary | ICD-10-CM | POA: Diagnosis not present

## 2017-12-16 DIAGNOSIS — R740 Nonspecific elevation of levels of transaminase and lactic acid dehydrogenase [LDH]: Secondary | ICD-10-CM | POA: Diagnosis not present

## 2017-12-16 DIAGNOSIS — K766 Portal hypertension: Secondary | ICD-10-CM | POA: Diagnosis not present

## 2017-12-16 DIAGNOSIS — G8929 Other chronic pain: Secondary | ICD-10-CM | POA: Diagnosis not present

## 2017-12-16 DIAGNOSIS — I10 Essential (primary) hypertension: Secondary | ICD-10-CM | POA: Diagnosis not present

## 2017-12-16 DIAGNOSIS — R109 Unspecified abdominal pain: Secondary | ICD-10-CM | POA: Diagnosis not present

## 2017-12-16 DIAGNOSIS — F1010 Alcohol abuse, uncomplicated: Secondary | ICD-10-CM | POA: Diagnosis not present

## 2017-12-16 DIAGNOSIS — G4700 Insomnia, unspecified: Secondary | ICD-10-CM | POA: Diagnosis not present

## 2017-12-18 DIAGNOSIS — I10 Essential (primary) hypertension: Secondary | ICD-10-CM | POA: Diagnosis not present

## 2017-12-18 DIAGNOSIS — G4700 Insomnia, unspecified: Secondary | ICD-10-CM | POA: Diagnosis not present

## 2017-12-19 DIAGNOSIS — I10 Essential (primary) hypertension: Secondary | ICD-10-CM | POA: Diagnosis not present

## 2017-12-19 DIAGNOSIS — R100 Acute abdomen: Secondary | ICD-10-CM | POA: Diagnosis not present

## 2017-12-19 DIAGNOSIS — K5900 Constipation, unspecified: Secondary | ICD-10-CM | POA: Diagnosis not present

## 2017-12-19 DIAGNOSIS — R42 Dizziness and giddiness: Secondary | ICD-10-CM | POA: Diagnosis not present

## 2017-12-27 DIAGNOSIS — G8929 Other chronic pain: Secondary | ICD-10-CM | POA: Diagnosis not present

## 2017-12-27 DIAGNOSIS — R100 Acute abdomen: Secondary | ICD-10-CM | POA: Diagnosis not present

## 2017-12-30 DIAGNOSIS — K3189 Other diseases of stomach and duodenum: Secondary | ICD-10-CM | POA: Diagnosis not present

## 2017-12-30 DIAGNOSIS — Z9181 History of falling: Secondary | ICD-10-CM | POA: Diagnosis not present

## 2017-12-30 DIAGNOSIS — K2900 Acute gastritis without bleeding: Secondary | ICD-10-CM | POA: Diagnosis not present

## 2017-12-30 DIAGNOSIS — G8929 Other chronic pain: Secondary | ICD-10-CM | POA: Diagnosis not present

## 2017-12-30 DIAGNOSIS — Z79891 Long term (current) use of opiate analgesic: Secondary | ICD-10-CM | POA: Diagnosis not present

## 2017-12-30 DIAGNOSIS — D519 Vitamin B12 deficiency anemia, unspecified: Secondary | ICD-10-CM | POA: Diagnosis not present

## 2017-12-30 DIAGNOSIS — R001 Bradycardia, unspecified: Secondary | ICD-10-CM | POA: Diagnosis not present

## 2018-01-03 DIAGNOSIS — D519 Vitamin B12 deficiency anemia, unspecified: Secondary | ICD-10-CM | POA: Diagnosis not present

## 2018-01-03 DIAGNOSIS — Z9181 History of falling: Secondary | ICD-10-CM | POA: Diagnosis not present

## 2018-01-03 DIAGNOSIS — K3189 Other diseases of stomach and duodenum: Secondary | ICD-10-CM | POA: Diagnosis not present

## 2018-01-03 DIAGNOSIS — G8929 Other chronic pain: Secondary | ICD-10-CM | POA: Diagnosis not present

## 2018-01-03 DIAGNOSIS — Z79891 Long term (current) use of opiate analgesic: Secondary | ICD-10-CM | POA: Diagnosis not present

## 2018-01-03 DIAGNOSIS — K2900 Acute gastritis without bleeding: Secondary | ICD-10-CM | POA: Diagnosis not present

## 2018-01-03 DIAGNOSIS — R001 Bradycardia, unspecified: Secondary | ICD-10-CM | POA: Diagnosis not present

## 2018-01-04 DIAGNOSIS — R001 Bradycardia, unspecified: Secondary | ICD-10-CM | POA: Diagnosis not present

## 2018-01-04 DIAGNOSIS — G8929 Other chronic pain: Secondary | ICD-10-CM | POA: Diagnosis not present

## 2018-01-04 DIAGNOSIS — Z9181 History of falling: Secondary | ICD-10-CM | POA: Diagnosis not present

## 2018-01-04 DIAGNOSIS — K2900 Acute gastritis without bleeding: Secondary | ICD-10-CM | POA: Diagnosis not present

## 2018-01-04 DIAGNOSIS — Z79891 Long term (current) use of opiate analgesic: Secondary | ICD-10-CM | POA: Diagnosis not present

## 2018-01-04 DIAGNOSIS — D519 Vitamin B12 deficiency anemia, unspecified: Secondary | ICD-10-CM | POA: Diagnosis not present

## 2018-01-04 DIAGNOSIS — K3189 Other diseases of stomach and duodenum: Secondary | ICD-10-CM | POA: Diagnosis not present

## 2018-01-05 DIAGNOSIS — K2900 Acute gastritis without bleeding: Secondary | ICD-10-CM | POA: Diagnosis not present

## 2018-01-05 DIAGNOSIS — Z9181 History of falling: Secondary | ICD-10-CM | POA: Diagnosis not present

## 2018-01-05 DIAGNOSIS — D519 Vitamin B12 deficiency anemia, unspecified: Secondary | ICD-10-CM | POA: Diagnosis not present

## 2018-01-05 DIAGNOSIS — R001 Bradycardia, unspecified: Secondary | ICD-10-CM | POA: Diagnosis not present

## 2018-01-05 DIAGNOSIS — Z79891 Long term (current) use of opiate analgesic: Secondary | ICD-10-CM | POA: Diagnosis not present

## 2018-01-05 DIAGNOSIS — G8929 Other chronic pain: Secondary | ICD-10-CM | POA: Diagnosis not present

## 2018-01-05 DIAGNOSIS — K3189 Other diseases of stomach and duodenum: Secondary | ICD-10-CM | POA: Diagnosis not present

## 2018-01-09 DIAGNOSIS — K7460 Unspecified cirrhosis of liver: Secondary | ICD-10-CM | POA: Diagnosis not present

## 2018-01-09 DIAGNOSIS — G629 Polyneuropathy, unspecified: Secondary | ICD-10-CM | POA: Diagnosis not present

## 2018-01-09 DIAGNOSIS — Z79899 Other long term (current) drug therapy: Secondary | ICD-10-CM | POA: Diagnosis not present

## 2018-01-09 DIAGNOSIS — M6281 Muscle weakness (generalized): Secondary | ICD-10-CM | POA: Diagnosis not present

## 2018-01-10 DIAGNOSIS — G8929 Other chronic pain: Secondary | ICD-10-CM | POA: Diagnosis not present

## 2018-01-10 DIAGNOSIS — K2900 Acute gastritis without bleeding: Secondary | ICD-10-CM | POA: Diagnosis not present

## 2018-01-10 DIAGNOSIS — Z9181 History of falling: Secondary | ICD-10-CM | POA: Diagnosis not present

## 2018-01-10 DIAGNOSIS — R001 Bradycardia, unspecified: Secondary | ICD-10-CM | POA: Diagnosis not present

## 2018-01-10 DIAGNOSIS — K3189 Other diseases of stomach and duodenum: Secondary | ICD-10-CM | POA: Diagnosis not present

## 2018-01-10 DIAGNOSIS — Z79891 Long term (current) use of opiate analgesic: Secondary | ICD-10-CM | POA: Diagnosis not present

## 2018-01-10 DIAGNOSIS — D519 Vitamin B12 deficiency anemia, unspecified: Secondary | ICD-10-CM | POA: Diagnosis not present

## 2018-01-13 DIAGNOSIS — D519 Vitamin B12 deficiency anemia, unspecified: Secondary | ICD-10-CM | POA: Diagnosis not present

## 2018-01-13 DIAGNOSIS — G8929 Other chronic pain: Secondary | ICD-10-CM | POA: Diagnosis not present

## 2018-01-13 DIAGNOSIS — K3189 Other diseases of stomach and duodenum: Secondary | ICD-10-CM | POA: Diagnosis not present

## 2018-01-13 DIAGNOSIS — K2900 Acute gastritis without bleeding: Secondary | ICD-10-CM | POA: Diagnosis not present

## 2018-01-13 DIAGNOSIS — Z9181 History of falling: Secondary | ICD-10-CM | POA: Diagnosis not present

## 2018-01-13 DIAGNOSIS — Z79891 Long term (current) use of opiate analgesic: Secondary | ICD-10-CM | POA: Diagnosis not present

## 2018-01-13 DIAGNOSIS — R001 Bradycardia, unspecified: Secondary | ICD-10-CM | POA: Diagnosis not present

## 2018-01-24 DIAGNOSIS — L03116 Cellulitis of left lower limb: Secondary | ICD-10-CM | POA: Diagnosis not present

## 2018-01-24 DIAGNOSIS — K7460 Unspecified cirrhosis of liver: Secondary | ICD-10-CM | POA: Diagnosis not present

## 2018-01-24 DIAGNOSIS — E039 Hypothyroidism, unspecified: Secondary | ICD-10-CM | POA: Diagnosis not present

## 2018-01-24 DIAGNOSIS — I509 Heart failure, unspecified: Secondary | ICD-10-CM | POA: Diagnosis not present

## 2018-01-24 DIAGNOSIS — K219 Gastro-esophageal reflux disease without esophagitis: Secondary | ICD-10-CM | POA: Diagnosis not present

## 2018-01-24 DIAGNOSIS — G8929 Other chronic pain: Secondary | ICD-10-CM | POA: Diagnosis not present

## 2018-01-24 DIAGNOSIS — G629 Polyneuropathy, unspecified: Secondary | ICD-10-CM | POA: Diagnosis not present

## 2018-01-24 DIAGNOSIS — K2900 Acute gastritis without bleeding: Secondary | ICD-10-CM | POA: Diagnosis not present

## 2018-01-24 DIAGNOSIS — Z79891 Long term (current) use of opiate analgesic: Secondary | ICD-10-CM | POA: Diagnosis not present

## 2018-01-24 DIAGNOSIS — Z79899 Other long term (current) drug therapy: Secondary | ICD-10-CM | POA: Diagnosis not present

## 2018-01-24 DIAGNOSIS — E876 Hypokalemia: Secondary | ICD-10-CM | POA: Diagnosis not present

## 2018-01-24 DIAGNOSIS — R001 Bradycardia, unspecified: Secondary | ICD-10-CM | POA: Diagnosis not present

## 2018-01-24 DIAGNOSIS — D519 Vitamin B12 deficiency anemia, unspecified: Secondary | ICD-10-CM | POA: Diagnosis not present

## 2018-01-24 DIAGNOSIS — Z881 Allergy status to other antibiotic agents status: Secondary | ICD-10-CM | POA: Diagnosis not present

## 2018-01-24 DIAGNOSIS — E871 Hypo-osmolality and hyponatremia: Secondary | ICD-10-CM | POA: Diagnosis not present

## 2018-01-24 DIAGNOSIS — K3189 Other diseases of stomach and duodenum: Secondary | ICD-10-CM | POA: Diagnosis not present

## 2018-01-24 DIAGNOSIS — K766 Portal hypertension: Secondary | ICD-10-CM | POA: Diagnosis not present

## 2018-01-24 DIAGNOSIS — I110 Hypertensive heart disease with heart failure: Secondary | ICD-10-CM | POA: Diagnosis not present

## 2018-01-24 DIAGNOSIS — Z9181 History of falling: Secondary | ICD-10-CM | POA: Diagnosis not present

## 2018-01-25 DIAGNOSIS — Z87898 Personal history of other specified conditions: Secondary | ICD-10-CM

## 2018-01-25 DIAGNOSIS — I110 Hypertensive heart disease with heart failure: Secondary | ICD-10-CM | POA: Diagnosis not present

## 2018-01-25 DIAGNOSIS — K7460 Unspecified cirrhosis of liver: Secondary | ICD-10-CM

## 2018-01-25 DIAGNOSIS — E876 Hypokalemia: Secondary | ICD-10-CM | POA: Diagnosis not present

## 2018-01-25 DIAGNOSIS — K766 Portal hypertension: Secondary | ICD-10-CM

## 2018-01-25 DIAGNOSIS — K3189 Other diseases of stomach and duodenum: Secondary | ICD-10-CM | POA: Diagnosis not present

## 2018-01-25 DIAGNOSIS — R609 Edema, unspecified: Secondary | ICD-10-CM | POA: Diagnosis not present

## 2018-01-25 DIAGNOSIS — R600 Localized edema: Secondary | ICD-10-CM | POA: Diagnosis not present

## 2018-01-25 DIAGNOSIS — I10 Essential (primary) hypertension: Secondary | ICD-10-CM

## 2018-01-25 DIAGNOSIS — L03116 Cellulitis of left lower limb: Secondary | ICD-10-CM | POA: Diagnosis not present

## 2018-01-25 DIAGNOSIS — I509 Heart failure, unspecified: Secondary | ICD-10-CM | POA: Diagnosis not present

## 2018-01-25 DIAGNOSIS — L0390 Cellulitis, unspecified: Secondary | ICD-10-CM | POA: Diagnosis not present

## 2018-01-25 DIAGNOSIS — G629 Polyneuropathy, unspecified: Secondary | ICD-10-CM | POA: Diagnosis not present

## 2018-01-25 DIAGNOSIS — R7989 Other specified abnormal findings of blood chemistry: Secondary | ICD-10-CM | POA: Diagnosis not present

## 2018-01-25 DIAGNOSIS — Z79899 Other long term (current) drug therapy: Secondary | ICD-10-CM | POA: Diagnosis not present

## 2018-01-25 DIAGNOSIS — R0602 Shortness of breath: Secondary | ICD-10-CM | POA: Diagnosis not present

## 2018-01-25 DIAGNOSIS — I340 Nonrheumatic mitral (valve) insufficiency: Secondary | ICD-10-CM | POA: Diagnosis not present

## 2018-01-25 DIAGNOSIS — F319 Bipolar disorder, unspecified: Secondary | ICD-10-CM

## 2018-01-25 DIAGNOSIS — K219 Gastro-esophageal reflux disease without esophagitis: Secondary | ICD-10-CM | POA: Diagnosis not present

## 2018-01-25 DIAGNOSIS — E039 Hypothyroidism, unspecified: Secondary | ICD-10-CM

## 2018-01-25 DIAGNOSIS — E871 Hypo-osmolality and hyponatremia: Secondary | ICD-10-CM | POA: Diagnosis not present

## 2018-01-25 DIAGNOSIS — Z881 Allergy status to other antibiotic agents status: Secondary | ICD-10-CM | POA: Diagnosis not present

## 2018-01-26 DIAGNOSIS — R45851 Suicidal ideations: Secondary | ICD-10-CM | POA: Diagnosis not present

## 2018-01-26 DIAGNOSIS — E871 Hypo-osmolality and hyponatremia: Secondary | ICD-10-CM | POA: Diagnosis not present

## 2018-01-26 DIAGNOSIS — Z87898 Personal history of other specified conditions: Secondary | ICD-10-CM | POA: Diagnosis not present

## 2018-01-26 DIAGNOSIS — Z7401 Bed confinement status: Secondary | ICD-10-CM | POA: Diagnosis not present

## 2018-01-26 DIAGNOSIS — R5381 Other malaise: Secondary | ICD-10-CM | POA: Diagnosis not present

## 2018-01-26 DIAGNOSIS — R2681 Unsteadiness on feet: Secondary | ICD-10-CM | POA: Diagnosis not present

## 2018-01-26 DIAGNOSIS — G629 Polyneuropathy, unspecified: Secondary | ICD-10-CM | POA: Diagnosis not present

## 2018-01-26 DIAGNOSIS — Z79891 Long term (current) use of opiate analgesic: Secondary | ICD-10-CM | POA: Diagnosis not present

## 2018-01-26 DIAGNOSIS — Z79899 Other long term (current) drug therapy: Secondary | ICD-10-CM | POA: Diagnosis not present

## 2018-01-26 DIAGNOSIS — R52 Pain, unspecified: Secondary | ICD-10-CM | POA: Diagnosis not present

## 2018-01-26 DIAGNOSIS — I509 Heart failure, unspecified: Secondary | ICD-10-CM | POA: Diagnosis not present

## 2018-01-26 DIAGNOSIS — G4700 Insomnia, unspecified: Secondary | ICD-10-CM | POA: Diagnosis not present

## 2018-01-26 DIAGNOSIS — R600 Localized edema: Secondary | ICD-10-CM | POA: Diagnosis not present

## 2018-01-26 DIAGNOSIS — M25552 Pain in left hip: Secondary | ICD-10-CM | POA: Diagnosis not present

## 2018-01-26 DIAGNOSIS — F419 Anxiety disorder, unspecified: Secondary | ICD-10-CM | POA: Diagnosis not present

## 2018-01-26 DIAGNOSIS — K3189 Other diseases of stomach and duodenum: Secondary | ICD-10-CM | POA: Diagnosis not present

## 2018-01-26 DIAGNOSIS — Z7409 Other reduced mobility: Secondary | ICD-10-CM | POA: Diagnosis not present

## 2018-01-26 DIAGNOSIS — W19XXXD Unspecified fall, subsequent encounter: Secondary | ICD-10-CM | POA: Diagnosis not present

## 2018-01-26 DIAGNOSIS — M6281 Muscle weakness (generalized): Secondary | ICD-10-CM | POA: Diagnosis not present

## 2018-01-26 DIAGNOSIS — Z743 Need for continuous supervision: Secondary | ICD-10-CM | POA: Diagnosis not present

## 2018-01-26 DIAGNOSIS — K7460 Unspecified cirrhosis of liver: Secondary | ICD-10-CM | POA: Diagnosis not present

## 2018-01-26 DIAGNOSIS — R109 Unspecified abdominal pain: Secondary | ICD-10-CM | POA: Diagnosis not present

## 2018-01-26 DIAGNOSIS — S72142A Displaced intertrochanteric fracture of left femur, initial encounter for closed fracture: Secondary | ICD-10-CM | POA: Diagnosis not present

## 2018-01-26 DIAGNOSIS — Z9889 Other specified postprocedural states: Secondary | ICD-10-CM | POA: Diagnosis not present

## 2018-01-26 DIAGNOSIS — R42 Dizziness and giddiness: Secondary | ICD-10-CM | POA: Diagnosis not present

## 2018-01-26 DIAGNOSIS — R000 Tachycardia, unspecified: Secondary | ICD-10-CM | POA: Diagnosis not present

## 2018-01-26 DIAGNOSIS — Z792 Long term (current) use of antibiotics: Secondary | ICD-10-CM | POA: Diagnosis not present

## 2018-01-26 DIAGNOSIS — E309 Disorder of puberty, unspecified: Secondary | ICD-10-CM

## 2018-01-26 DIAGNOSIS — Z23 Encounter for immunization: Secondary | ICD-10-CM | POA: Diagnosis not present

## 2018-01-26 DIAGNOSIS — E039 Hypothyroidism, unspecified: Secondary | ICD-10-CM | POA: Diagnosis not present

## 2018-01-26 DIAGNOSIS — L03116 Cellulitis of left lower limb: Secondary | ICD-10-CM | POA: Diagnosis not present

## 2018-01-26 DIAGNOSIS — M7989 Other specified soft tissue disorders: Secondary | ICD-10-CM | POA: Diagnosis not present

## 2018-01-26 DIAGNOSIS — D500 Iron deficiency anemia secondary to blood loss (chronic): Secondary | ICD-10-CM | POA: Diagnosis not present

## 2018-01-26 DIAGNOSIS — D638 Anemia in other chronic diseases classified elsewhere: Secondary | ICD-10-CM | POA: Diagnosis not present

## 2018-01-26 DIAGNOSIS — S0990XA Unspecified injury of head, initial encounter: Secondary | ICD-10-CM | POA: Diagnosis not present

## 2018-01-26 DIAGNOSIS — D62 Acute posthemorrhagic anemia: Secondary | ICD-10-CM | POA: Diagnosis not present

## 2018-01-26 DIAGNOSIS — E876 Hypokalemia: Secondary | ICD-10-CM | POA: Diagnosis not present

## 2018-01-26 DIAGNOSIS — I110 Hypertensive heart disease with heart failure: Secondary | ICD-10-CM | POA: Diagnosis not present

## 2018-01-26 DIAGNOSIS — I10 Essential (primary) hypertension: Secondary | ICD-10-CM | POA: Diagnosis not present

## 2018-01-26 DIAGNOSIS — R262 Difficulty in walking, not elsewhere classified: Secondary | ICD-10-CM | POA: Diagnosis not present

## 2018-01-26 DIAGNOSIS — L0390 Cellulitis, unspecified: Secondary | ICD-10-CM

## 2018-01-26 DIAGNOSIS — F319 Bipolar disorder, unspecified: Secondary | ICD-10-CM | POA: Diagnosis not present

## 2018-01-26 DIAGNOSIS — G8929 Other chronic pain: Secondary | ICD-10-CM | POA: Diagnosis not present

## 2018-01-26 DIAGNOSIS — R278 Other lack of coordination: Secondary | ICD-10-CM | POA: Diagnosis not present

## 2018-01-26 DIAGNOSIS — K766 Portal hypertension: Secondary | ICD-10-CM | POA: Diagnosis not present

## 2018-01-26 DIAGNOSIS — W19XXXA Unspecified fall, initial encounter: Secondary | ICD-10-CM | POA: Diagnosis not present

## 2018-01-26 DIAGNOSIS — K219 Gastro-esophageal reflux disease without esophagitis: Secondary | ICD-10-CM | POA: Diagnosis not present

## 2018-01-26 DIAGNOSIS — S299XXA Unspecified injury of thorax, initial encounter: Secondary | ICD-10-CM | POA: Diagnosis not present

## 2018-01-26 DIAGNOSIS — Z881 Allergy status to other antibiotic agents status: Secondary | ICD-10-CM | POA: Diagnosis not present

## 2018-01-27 DIAGNOSIS — S72142A Displaced intertrochanteric fracture of left femur, initial encounter for closed fracture: Secondary | ICD-10-CM | POA: Diagnosis not present

## 2018-01-27 DIAGNOSIS — K766 Portal hypertension: Secondary | ICD-10-CM

## 2018-01-27 DIAGNOSIS — R600 Localized edema: Secondary | ICD-10-CM

## 2018-02-03 DIAGNOSIS — Z9889 Other specified postprocedural states: Secondary | ICD-10-CM | POA: Diagnosis not present

## 2018-02-03 DIAGNOSIS — E039 Hypothyroidism, unspecified: Secondary | ICD-10-CM | POA: Diagnosis not present

## 2018-02-03 DIAGNOSIS — R110 Nausea: Secondary | ICD-10-CM | POA: Diagnosis not present

## 2018-02-03 DIAGNOSIS — I10 Essential (primary) hypertension: Secondary | ICD-10-CM | POA: Diagnosis not present

## 2018-02-03 DIAGNOSIS — K219 Gastro-esophageal reflux disease without esophagitis: Secondary | ICD-10-CM | POA: Diagnosis not present

## 2018-02-03 DIAGNOSIS — R600 Localized edema: Secondary | ICD-10-CM | POA: Diagnosis not present

## 2018-02-03 DIAGNOSIS — Z7409 Other reduced mobility: Secondary | ICD-10-CM | POA: Diagnosis not present

## 2018-02-03 DIAGNOSIS — Z79899 Other long term (current) drug therapy: Secondary | ICD-10-CM | POA: Diagnosis not present

## 2018-02-03 DIAGNOSIS — G4700 Insomnia, unspecified: Secondary | ICD-10-CM | POA: Diagnosis not present

## 2018-02-03 DIAGNOSIS — D62 Acute posthemorrhagic anemia: Secondary | ICD-10-CM | POA: Diagnosis not present

## 2018-02-03 DIAGNOSIS — R45851 Suicidal ideations: Secondary | ICD-10-CM | POA: Diagnosis not present

## 2018-02-03 DIAGNOSIS — G8929 Other chronic pain: Secondary | ICD-10-CM | POA: Diagnosis not present

## 2018-02-03 DIAGNOSIS — Z79891 Long term (current) use of opiate analgesic: Secondary | ICD-10-CM | POA: Diagnosis not present

## 2018-02-03 DIAGNOSIS — F319 Bipolar disorder, unspecified: Secondary | ICD-10-CM | POA: Diagnosis not present

## 2018-02-03 DIAGNOSIS — R42 Dizziness and giddiness: Secondary | ICD-10-CM | POA: Diagnosis not present

## 2018-02-03 DIAGNOSIS — Z792 Long term (current) use of antibiotics: Secondary | ICD-10-CM | POA: Diagnosis not present

## 2018-02-03 DIAGNOSIS — K7460 Unspecified cirrhosis of liver: Secondary | ICD-10-CM | POA: Diagnosis not present

## 2018-02-03 DIAGNOSIS — Z87898 Personal history of other specified conditions: Secondary | ICD-10-CM | POA: Diagnosis not present

## 2018-02-03 DIAGNOSIS — D638 Anemia in other chronic diseases classified elsewhere: Secondary | ICD-10-CM | POA: Diagnosis not present

## 2018-02-03 DIAGNOSIS — K5900 Constipation, unspecified: Secondary | ICD-10-CM | POA: Diagnosis not present

## 2018-02-03 DIAGNOSIS — R5381 Other malaise: Secondary | ICD-10-CM | POA: Diagnosis not present

## 2018-02-03 DIAGNOSIS — S72142A Displaced intertrochanteric fracture of left femur, initial encounter for closed fracture: Secondary | ICD-10-CM | POA: Diagnosis not present

## 2018-02-03 DIAGNOSIS — S72002A Fracture of unspecified part of neck of left femur, initial encounter for closed fracture: Secondary | ICD-10-CM | POA: Diagnosis not present

## 2018-02-03 DIAGNOSIS — Z743 Need for continuous supervision: Secondary | ICD-10-CM | POA: Diagnosis not present

## 2018-02-03 DIAGNOSIS — R278 Other lack of coordination: Secondary | ICD-10-CM | POA: Diagnosis not present

## 2018-02-03 DIAGNOSIS — M6281 Muscle weakness (generalized): Secondary | ICD-10-CM | POA: Diagnosis not present

## 2018-02-03 DIAGNOSIS — Z23 Encounter for immunization: Secondary | ICD-10-CM | POA: Diagnosis not present

## 2018-02-03 DIAGNOSIS — K3189 Other diseases of stomach and duodenum: Secondary | ICD-10-CM | POA: Diagnosis not present

## 2018-02-03 DIAGNOSIS — Z7401 Bed confinement status: Secondary | ICD-10-CM | POA: Diagnosis not present

## 2018-02-03 DIAGNOSIS — F419 Anxiety disorder, unspecified: Secondary | ICD-10-CM | POA: Diagnosis not present

## 2018-02-03 DIAGNOSIS — K766 Portal hypertension: Secondary | ICD-10-CM | POA: Diagnosis not present

## 2018-02-03 DIAGNOSIS — R109 Unspecified abdominal pain: Secondary | ICD-10-CM | POA: Diagnosis not present

## 2018-02-03 DIAGNOSIS — W19XXXD Unspecified fall, subsequent encounter: Secondary | ICD-10-CM | POA: Diagnosis not present

## 2018-02-03 DIAGNOSIS — D500 Iron deficiency anemia secondary to blood loss (chronic): Secondary | ICD-10-CM | POA: Diagnosis not present

## 2018-02-03 DIAGNOSIS — E871 Hypo-osmolality and hyponatremia: Secondary | ICD-10-CM | POA: Diagnosis not present

## 2018-02-03 DIAGNOSIS — E876 Hypokalemia: Secondary | ICD-10-CM | POA: Diagnosis not present

## 2018-02-03 DIAGNOSIS — G629 Polyneuropathy, unspecified: Secondary | ICD-10-CM | POA: Diagnosis not present

## 2018-02-03 DIAGNOSIS — L03116 Cellulitis of left lower limb: Secondary | ICD-10-CM | POA: Diagnosis not present

## 2018-02-03 DIAGNOSIS — R262 Difficulty in walking, not elsewhere classified: Secondary | ICD-10-CM | POA: Diagnosis not present

## 2018-02-03 DIAGNOSIS — R2681 Unsteadiness on feet: Secondary | ICD-10-CM | POA: Diagnosis not present

## 2018-02-06 DIAGNOSIS — D62 Acute posthemorrhagic anemia: Secondary | ICD-10-CM | POA: Diagnosis not present

## 2018-02-06 DIAGNOSIS — I10 Essential (primary) hypertension: Secondary | ICD-10-CM | POA: Diagnosis not present

## 2018-02-06 DIAGNOSIS — E876 Hypokalemia: Secondary | ICD-10-CM | POA: Diagnosis not present

## 2018-02-06 DIAGNOSIS — K5900 Constipation, unspecified: Secondary | ICD-10-CM | POA: Diagnosis not present

## 2018-02-07 DIAGNOSIS — R110 Nausea: Secondary | ICD-10-CM | POA: Diagnosis not present

## 2018-02-07 DIAGNOSIS — L03116 Cellulitis of left lower limb: Secondary | ICD-10-CM | POA: Diagnosis not present

## 2018-02-09 DIAGNOSIS — S72002A Fracture of unspecified part of neck of left femur, initial encounter for closed fracture: Secondary | ICD-10-CM | POA: Diagnosis not present

## 2018-02-26 DIAGNOSIS — Z79891 Long term (current) use of opiate analgesic: Secondary | ICD-10-CM | POA: Diagnosis not present

## 2018-02-26 DIAGNOSIS — Z9181 History of falling: Secondary | ICD-10-CM | POA: Diagnosis not present

## 2018-02-26 DIAGNOSIS — E039 Hypothyroidism, unspecified: Secondary | ICD-10-CM | POA: Diagnosis not present

## 2018-02-26 DIAGNOSIS — G8929 Other chronic pain: Secondary | ICD-10-CM | POA: Diagnosis not present

## 2018-02-26 DIAGNOSIS — K2900 Acute gastritis without bleeding: Secondary | ICD-10-CM | POA: Diagnosis not present

## 2018-02-26 DIAGNOSIS — G629 Polyneuropathy, unspecified: Secondary | ICD-10-CM | POA: Diagnosis not present

## 2018-02-26 DIAGNOSIS — K3189 Other diseases of stomach and duodenum: Secondary | ICD-10-CM | POA: Diagnosis not present

## 2018-02-26 DIAGNOSIS — I10 Essential (primary) hypertension: Secondary | ICD-10-CM | POA: Diagnosis not present

## 2018-02-26 DIAGNOSIS — R001 Bradycardia, unspecified: Secondary | ICD-10-CM | POA: Diagnosis not present

## 2018-02-26 DIAGNOSIS — S72142D Displaced intertrochanteric fracture of left femur, subsequent encounter for closed fracture with routine healing: Secondary | ICD-10-CM | POA: Diagnosis not present

## 2018-02-26 DIAGNOSIS — D519 Vitamin B12 deficiency anemia, unspecified: Secondary | ICD-10-CM | POA: Diagnosis not present

## 2018-02-27 DIAGNOSIS — Z79891 Long term (current) use of opiate analgesic: Secondary | ICD-10-CM | POA: Diagnosis not present

## 2018-02-27 DIAGNOSIS — R001 Bradycardia, unspecified: Secondary | ICD-10-CM | POA: Diagnosis not present

## 2018-02-27 DIAGNOSIS — K2900 Acute gastritis without bleeding: Secondary | ICD-10-CM | POA: Diagnosis not present

## 2018-02-27 DIAGNOSIS — K3189 Other diseases of stomach and duodenum: Secondary | ICD-10-CM | POA: Diagnosis not present

## 2018-02-27 DIAGNOSIS — G629 Polyneuropathy, unspecified: Secondary | ICD-10-CM | POA: Diagnosis not present

## 2018-02-27 DIAGNOSIS — Z9181 History of falling: Secondary | ICD-10-CM | POA: Diagnosis not present

## 2018-02-27 DIAGNOSIS — I10 Essential (primary) hypertension: Secondary | ICD-10-CM | POA: Diagnosis not present

## 2018-02-27 DIAGNOSIS — E039 Hypothyroidism, unspecified: Secondary | ICD-10-CM | POA: Diagnosis not present

## 2018-02-27 DIAGNOSIS — S72142D Displaced intertrochanteric fracture of left femur, subsequent encounter for closed fracture with routine healing: Secondary | ICD-10-CM | POA: Diagnosis not present

## 2018-02-27 DIAGNOSIS — D519 Vitamin B12 deficiency anemia, unspecified: Secondary | ICD-10-CM | POA: Diagnosis not present

## 2018-02-27 DIAGNOSIS — G8929 Other chronic pain: Secondary | ICD-10-CM | POA: Diagnosis not present

## 2018-03-03 ENCOUNTER — Ambulatory Visit: Payer: Self-pay | Admitting: Diagnostic Neuroimaging

## 2018-05-18 DIAGNOSIS — G629 Polyneuropathy, unspecified: Secondary | ICD-10-CM | POA: Diagnosis not present

## 2018-05-18 DIAGNOSIS — J449 Chronic obstructive pulmonary disease, unspecified: Secondary | ICD-10-CM | POA: Diagnosis not present

## 2018-05-18 DIAGNOSIS — Z6821 Body mass index (BMI) 21.0-21.9, adult: Secondary | ICD-10-CM | POA: Diagnosis not present

## 2018-05-18 DIAGNOSIS — K7460 Unspecified cirrhosis of liver: Secondary | ICD-10-CM | POA: Diagnosis not present

## 2018-06-02 DIAGNOSIS — Z79899 Other long term (current) drug therapy: Secondary | ICD-10-CM | POA: Diagnosis not present

## 2018-06-02 DIAGNOSIS — M549 Dorsalgia, unspecified: Secondary | ICD-10-CM | POA: Diagnosis not present

## 2018-06-02 DIAGNOSIS — R002 Palpitations: Secondary | ICD-10-CM | POA: Diagnosis not present

## 2018-06-02 DIAGNOSIS — R1110 Vomiting, unspecified: Secondary | ICD-10-CM | POA: Diagnosis not present

## 2018-06-02 DIAGNOSIS — K7460 Unspecified cirrhosis of liver: Secondary | ICD-10-CM | POA: Diagnosis not present

## 2018-06-02 DIAGNOSIS — G8929 Other chronic pain: Secondary | ICD-10-CM | POA: Diagnosis not present

## 2018-06-02 DIAGNOSIS — M25552 Pain in left hip: Secondary | ICD-10-CM | POA: Diagnosis not present

## 2018-06-02 DIAGNOSIS — I10 Essential (primary) hypertension: Secondary | ICD-10-CM | POA: Diagnosis not present

## 2018-06-05 DIAGNOSIS — M25552 Pain in left hip: Secondary | ICD-10-CM | POA: Diagnosis not present

## 2018-07-09 DIAGNOSIS — S59911A Unspecified injury of right forearm, initial encounter: Secondary | ICD-10-CM | POA: Diagnosis not present

## 2018-09-06 IMAGING — CR DG CHEST 2V
2 series · 2 of 2 positions shown · non-contrast
Comparison: Radiographs September 21, 2003.

CLINICAL DATA: Shortness of breath, chest pain.

EXAM:
CHEST  2 VIEW

[chest lat]
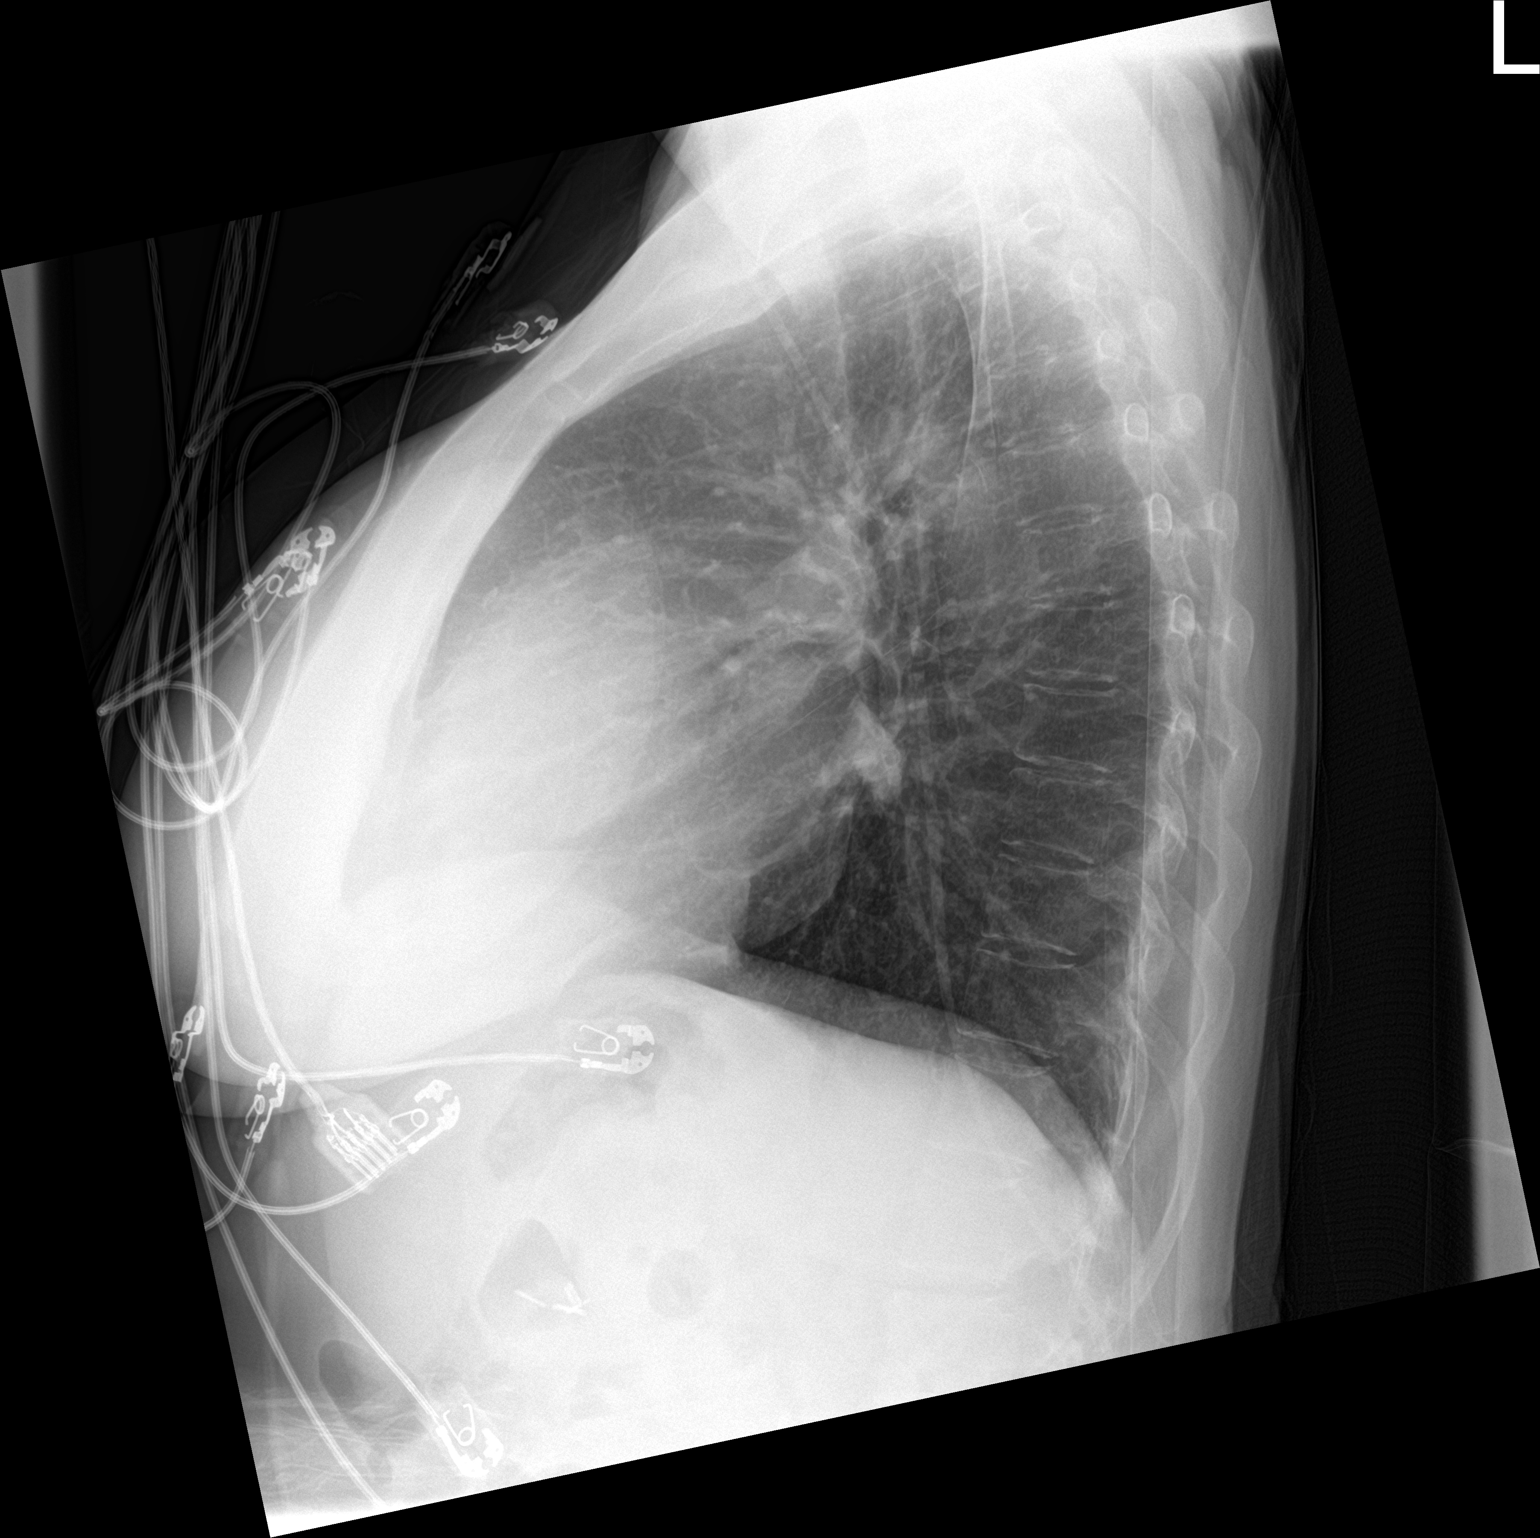

[chest ap]
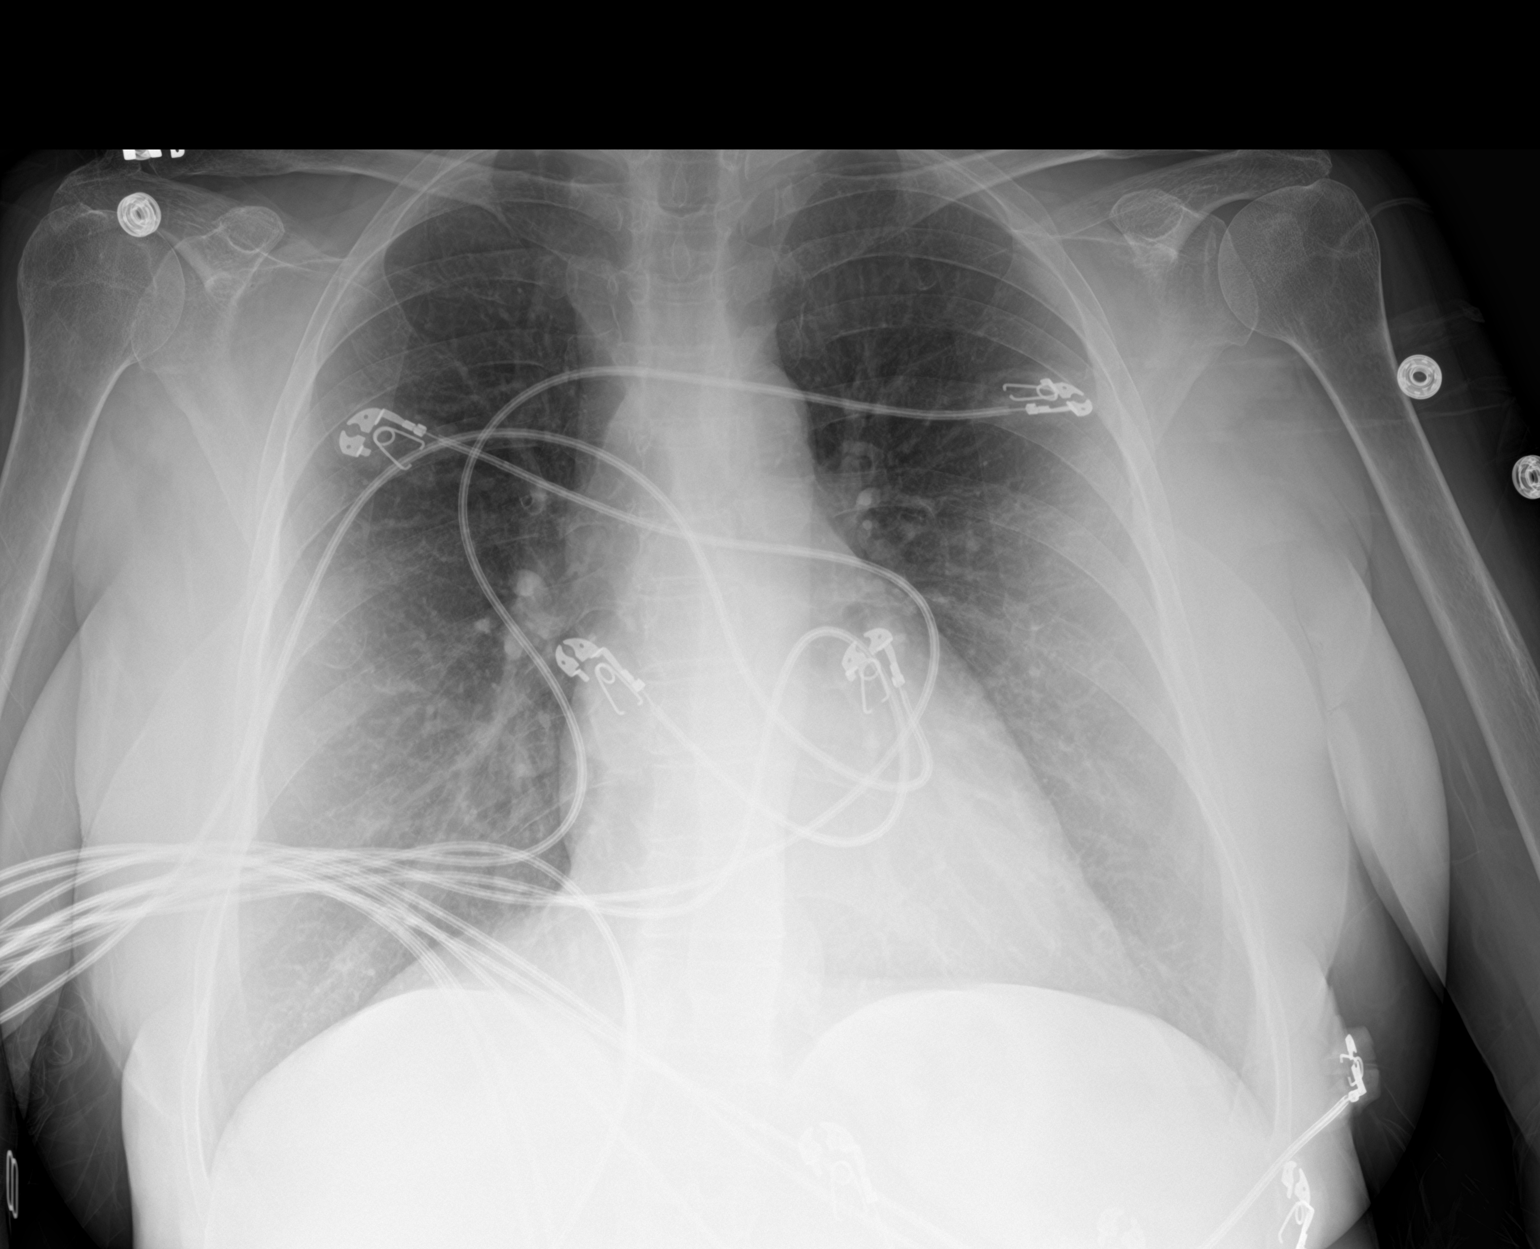

[2 of 2 positions shown; findings below may reference images not displayed]

FINDINGS: The heart size and mediastinal contours are within normal limits.
Both lungs are clear. No pneumothorax or pleural effusion is noted.
The visualized skeletal structures are unremarkable.
IMPRESSION: No active cardiopulmonary disease.

## 2018-09-07 IMAGING — CR DG CHEST 2V
2 series · 3 of 3 positions shown · non-contrast
Comparison: 04/20/2017 chest radiograph.

CLINICAL DATA: Pneumonia

EXAM:
CHEST  2 VIEW

[Series 2: chest lat · 0.14mm/px · 2 of 2 slices shown]
[im 1/2]
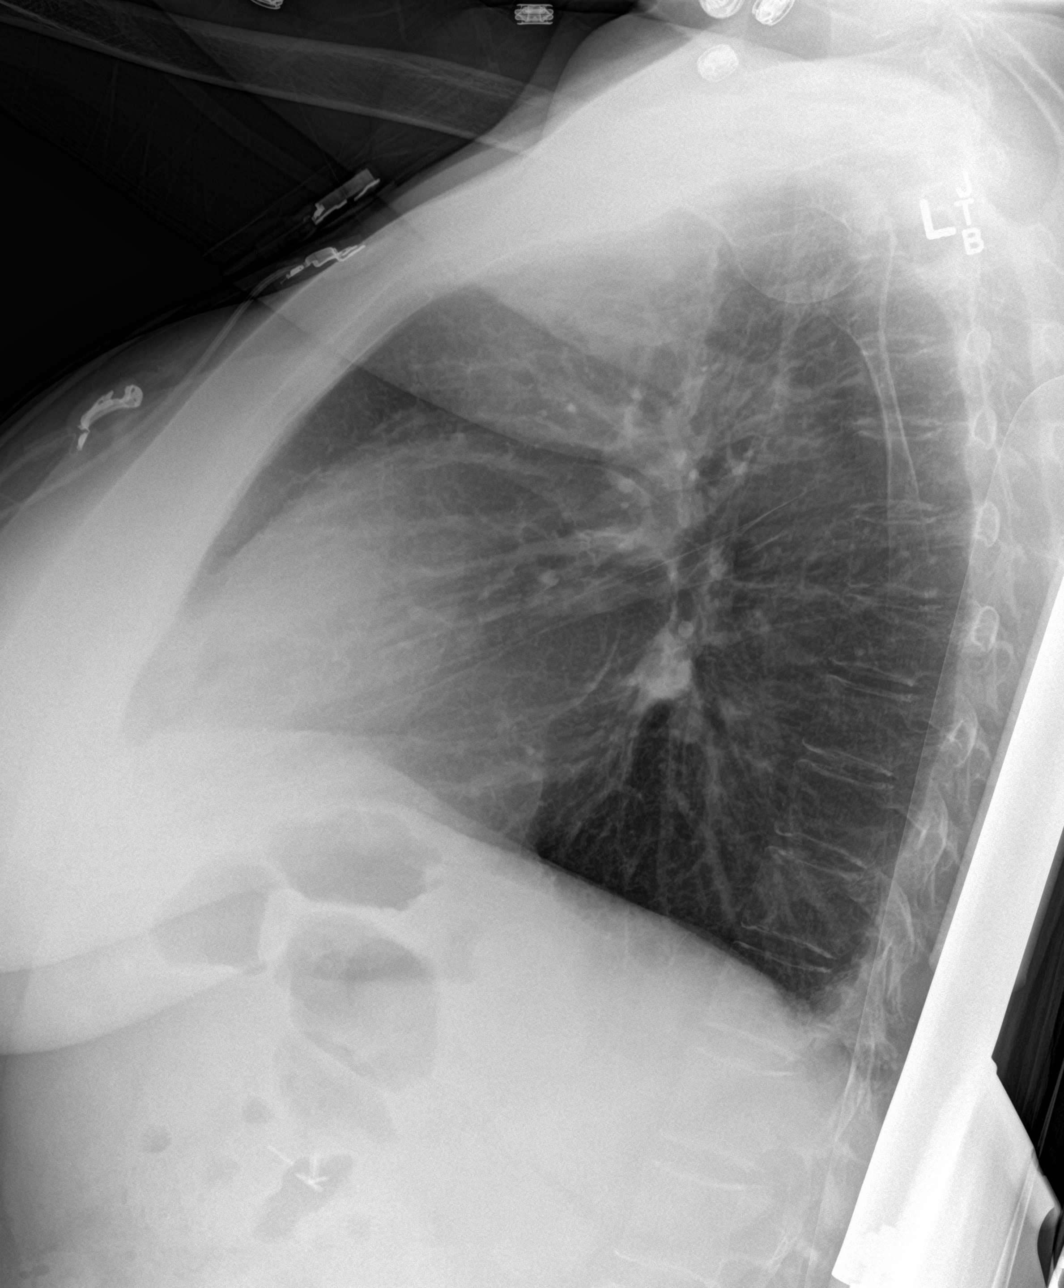
[im 2/2]
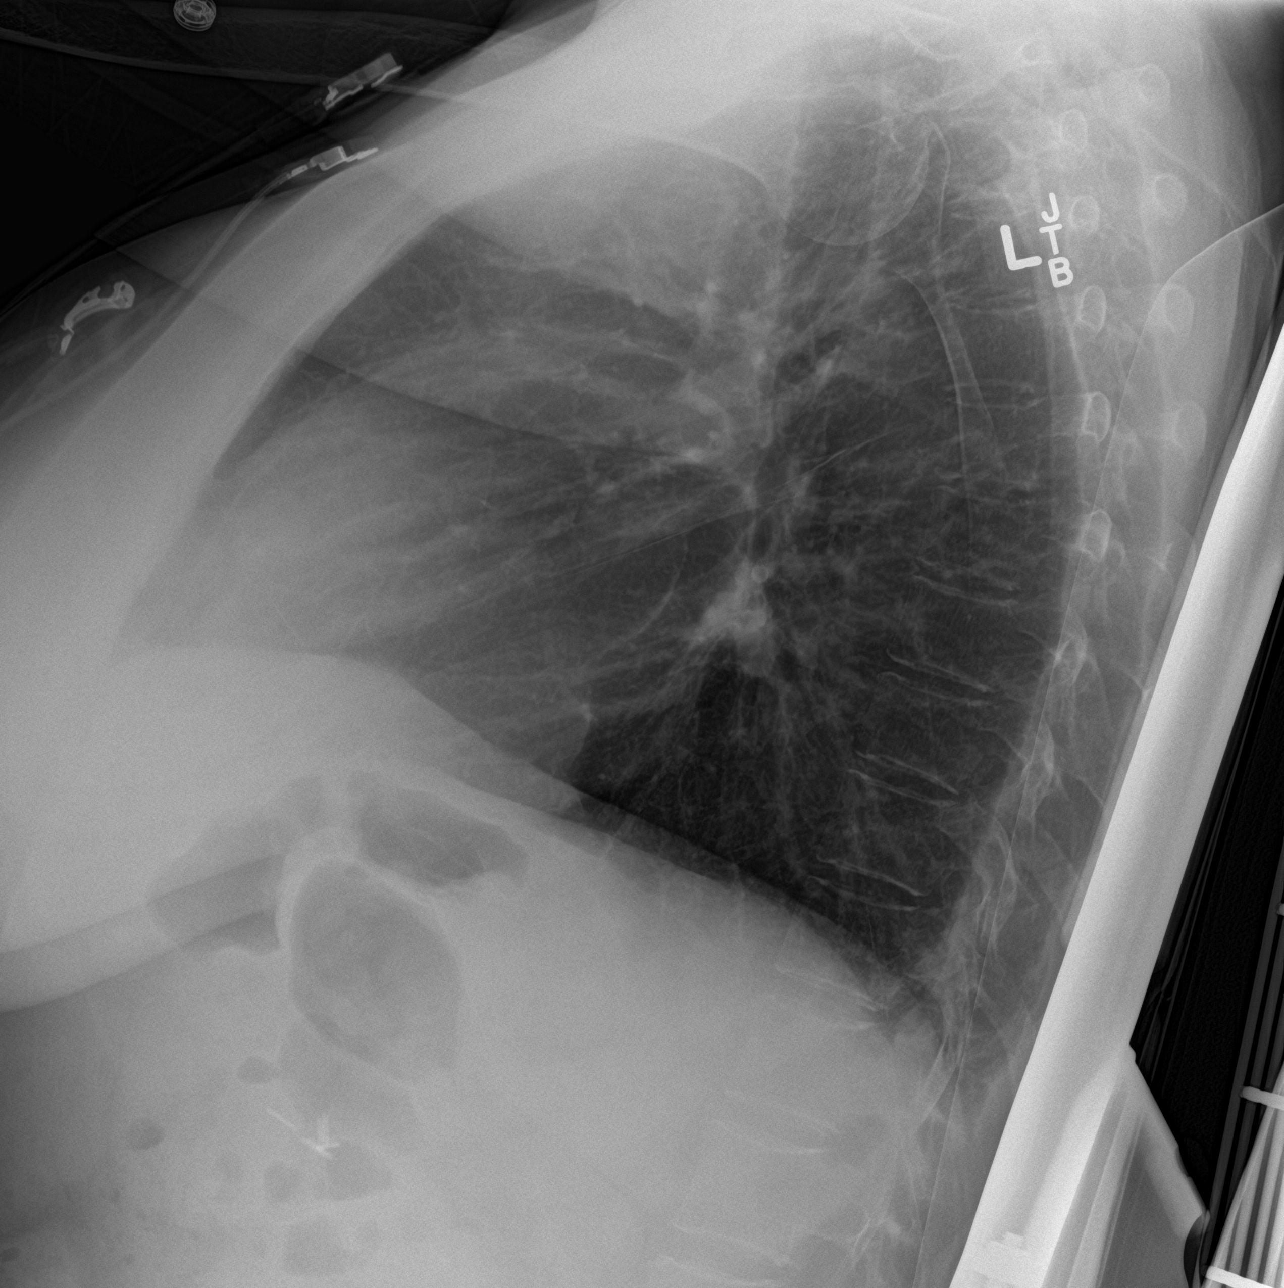

[chest ap]
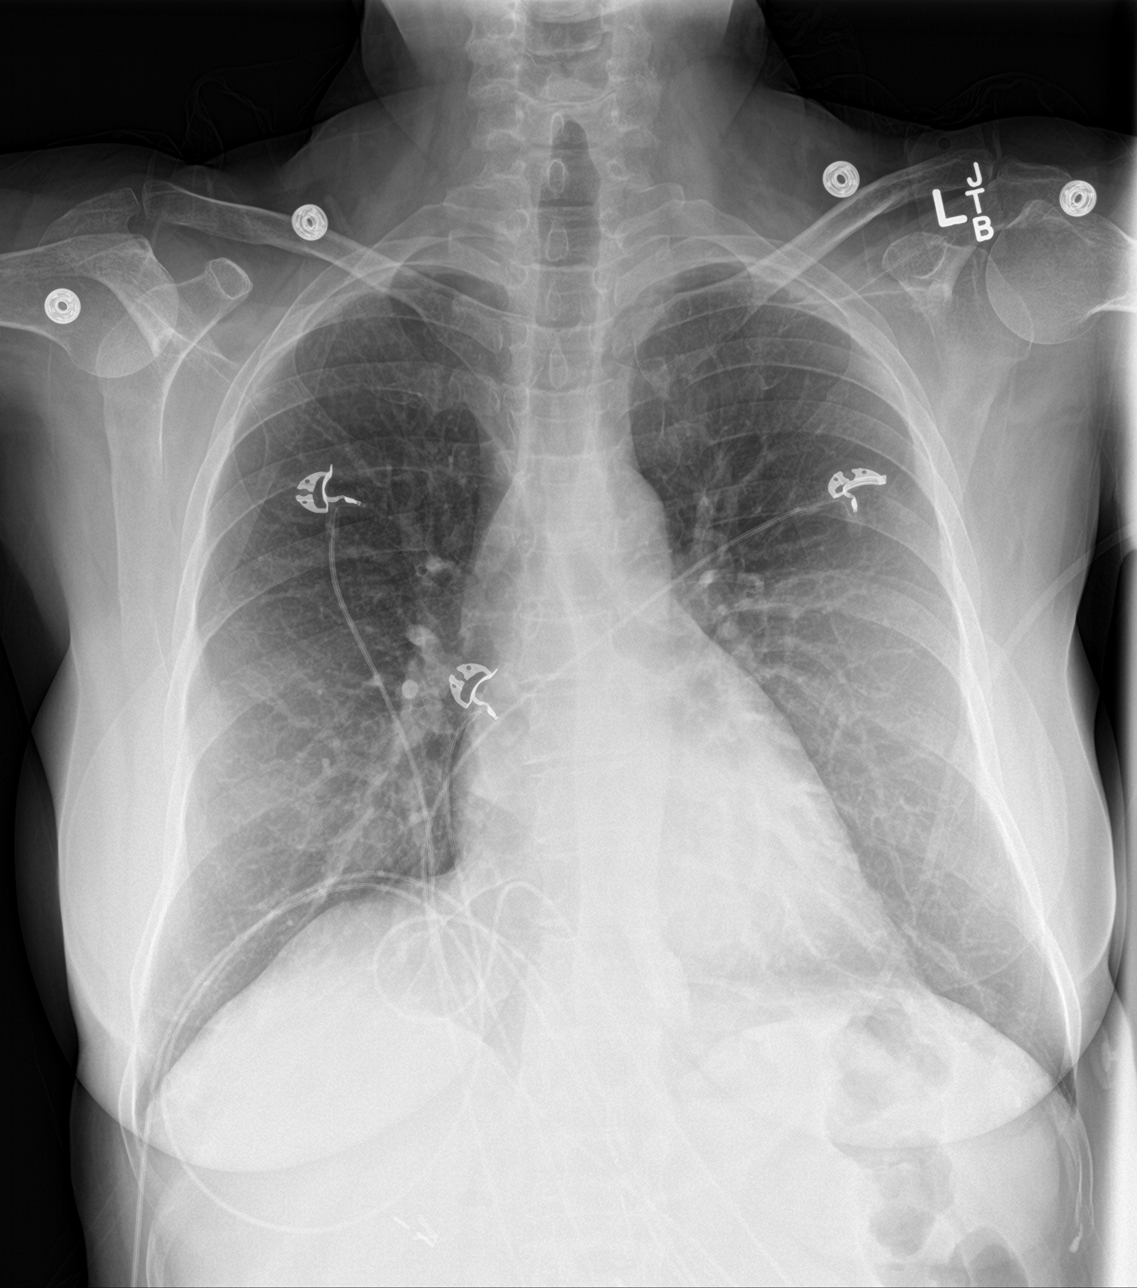

[3 of 3 positions shown; findings below may reference images not displayed]

FINDINGS: Stable cardiomediastinal silhouette with normal heart size. No
pneumothorax. No pleural effusion. Lungs appear clear, with no acute
consolidative airspace disease and no pulmonary edema.
Cholecystectomy clips are seen in the right upper quadrant of the
abdomen.
IMPRESSION: No active cardiopulmonary disease.

## 2018-10-02 ENCOUNTER — Other Ambulatory Visit: Payer: Self-pay

## 2018-10-02 NOTE — Patient Outreach (Addendum)
[facility]) Care Management  10/02/2018  Jamie [name] 09-25-1965 [id number]  Received referral from [place] on Friday 09-29-2018 to follow up with [facility] Medicare member for Complex CM.  Called member at preferred number. Member confirmed her name. [employer] benefits explained to member. Member refused to verify other identities. Made member aware that a brochure will be mailed to her home and member stated "I am homeless and I have to go." Call was disconnected.   Will send pamphlet and [employer] nurse-on-call magnet to address listed. Will follow up with member in 2 weeks.  Jamie Mola "ANN" [name], RN-BSN  [facility] Care Management  Community Care Management Coordinator  [phone] [email or web address]

## 2018-10-11 DIAGNOSIS — J449 Chronic obstructive pulmonary disease, unspecified: Secondary | ICD-10-CM | POA: Diagnosis not present

## 2018-10-11 DIAGNOSIS — G629 Polyneuropathy, unspecified: Secondary | ICD-10-CM | POA: Diagnosis not present

## 2018-10-11 DIAGNOSIS — K7460 Unspecified cirrhosis of liver: Secondary | ICD-10-CM | POA: Diagnosis not present

## 2018-10-16 ENCOUNTER — Other Ambulatory Visit: Payer: Self-pay

## 2018-10-16 NOTE — Patient Outreach (Signed)
[facility]) Care Management  10/16/2018  FLONNIE WIERMAN January 01, 1966 [id number]  Unsuccessful Outreach call x 1.  Received referral from [place] on Friday 09-29-2018 to follow up with [facility] Medicare member for Complex CM.  Called member at preferred number and no answer, HIPPA compliant voicemail message left.   Will call member within 3-4 business days.  [name], RN-BSN  [facility] Care Management  Community Care Management Coordinator  [phone] [email or web address]

## 2018-10-19 ENCOUNTER — Other Ambulatory Visit: Payer: Self-pay

## 2018-10-19 NOTE — Patient Outreach (Signed)
[facility]) Care Management  10/19/2018  Jamie [name] September 26, 1965 [id number]   Unsuccessful Outreach x2.  Received referral from [place] on Friday 09-29-2018 to follow up with [facility] Medicare member for Complex CM.  Called member at preferred number and unable to leave HIPPA compliant voicemail due to voicemail full.  Will call member within 3-4 business days.  [name], RN-BSN  [facility] Care Management  Community Care Management Coordinator  [phone] [email or web address]

## 2018-10-24 ENCOUNTER — Other Ambulatory Visit: Payer: Self-pay

## 2018-10-24 NOTE — Patient Outreach (Addendum)
[facility]) Care Management  10/24/2018  Jamie [name] 05-30-1965 [id number]   Received referral from [name] on Friday 09-29-2018 to follow up with [name] Medicare member for Complex CM.  Called member at preferred number and no answer. HIPPA compliant voicemail message left. Member returned call and HIPPA identifiers verified. Introduced self and explained [employer] services/benefit to member.   Member verbalized acceptance of services and stated preference to be called at a later date as she currently has company at her home. Member stated new address is [address], [place], [place] [address]  Will send successful outreach letter and welcome letter to member to new address. Will follow up with member next week as member requested.   [name], RN-BSN  [facility] Care Management  Community Care Management Coordinator  [phone] [email or web address]

## 2018-10-24 NOTE — Patient Outreach (Deleted)
[facility]) Care Management  10/24/2018  Jamie [name] 09/26/1965 [id number]  Called member at preferred number and no answer. HIPPA compliant voice mail message left introducing self and requested return call when available.

## 2018-11-02 ENCOUNTER — Other Ambulatory Visit: Payer: Self-pay

## 2018-11-02 NOTE — Patient Outreach (Addendum)
[place]) Care Management  11/02/2018  Jamie [name] Sep 16, 1965 [id number]  Unsuccessful Outreach   Called member at preferred number and member answered phone. Attempted to verify member's address; however, member would not. Member asked "what is this about" and then stated "the movers are here at my house, can you call back at a later time"? Verbalized agreement to return call.  Will return call to member in 3-4 business days and member verbalized agreement. Unsuccessful Outreach letter previously sent.   [name], RN-BSN  [facility] Care Management  Community Care Management Coordinator  [phone] [email or web address]

## 2018-11-07 ENCOUNTER — Other Ambulatory Visit: Payer: Self-pay

## 2018-11-07 NOTE — Patient Outreach (Addendum)
[facility]) Care Management  11/07/2018  Jamie [name] 05-Apr-1966 [id number]  Unsuccessful Outreach x3  Called member at preferred number and when introducing self, call was discontinued. Returned call and no answer and nable to leave HIPPA compliant voice mail message.   Will close case today as today equates to 10 day hold process according to [facility] workflow. Will send MD case closure letter. No other [employer] disciplines involved.  [name], RN-BSN  [facility] Care Management  Community Care Management Coordinator  [phone] [email or web address]

## 2018-11-13 DIAGNOSIS — L03116 Cellulitis of left lower limb: Secondary | ICD-10-CM | POA: Diagnosis not present

## 2018-11-29 DIAGNOSIS — L03116 Cellulitis of left lower limb: Secondary | ICD-10-CM | POA: Diagnosis not present

## 2018-11-30 DIAGNOSIS — Z6821 Body mass index (BMI) 21.0-21.9, adult: Secondary | ICD-10-CM | POA: Diagnosis not present

## 2018-11-30 DIAGNOSIS — Z79899 Other long term (current) drug therapy: Secondary | ICD-10-CM | POA: Diagnosis not present

## 2018-11-30 DIAGNOSIS — L03116 Cellulitis of left lower limb: Secondary | ICD-10-CM | POA: Diagnosis not present

## 2019-01-04 DIAGNOSIS — Z6822 Body mass index (BMI) 22.0-22.9, adult: Secondary | ICD-10-CM | POA: Diagnosis not present

## 2019-01-04 DIAGNOSIS — L03114 Cellulitis of left upper limb: Secondary | ICD-10-CM | POA: Diagnosis not present

## 2019-01-31 IMAGING — CR DG ABDOMEN ACUTE W/ 1V CHEST
4 series · 4 of 4 positions shown · non-contrast
Comparison: 07/12/2017

CLINICAL DATA: Abdominal pain, nausea, vomiting

EXAM:
DG ABDOMEN ACUTE W/ 1V CHEST

[w chest pa]
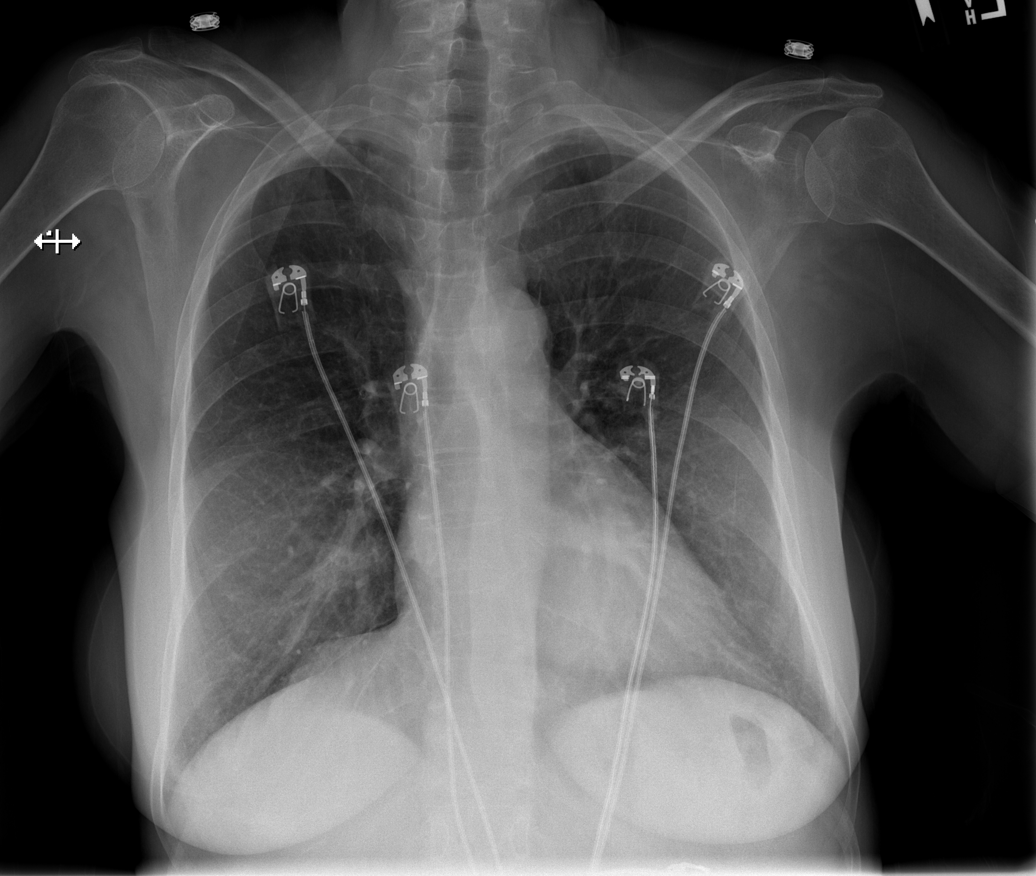

[w abdomen upright]
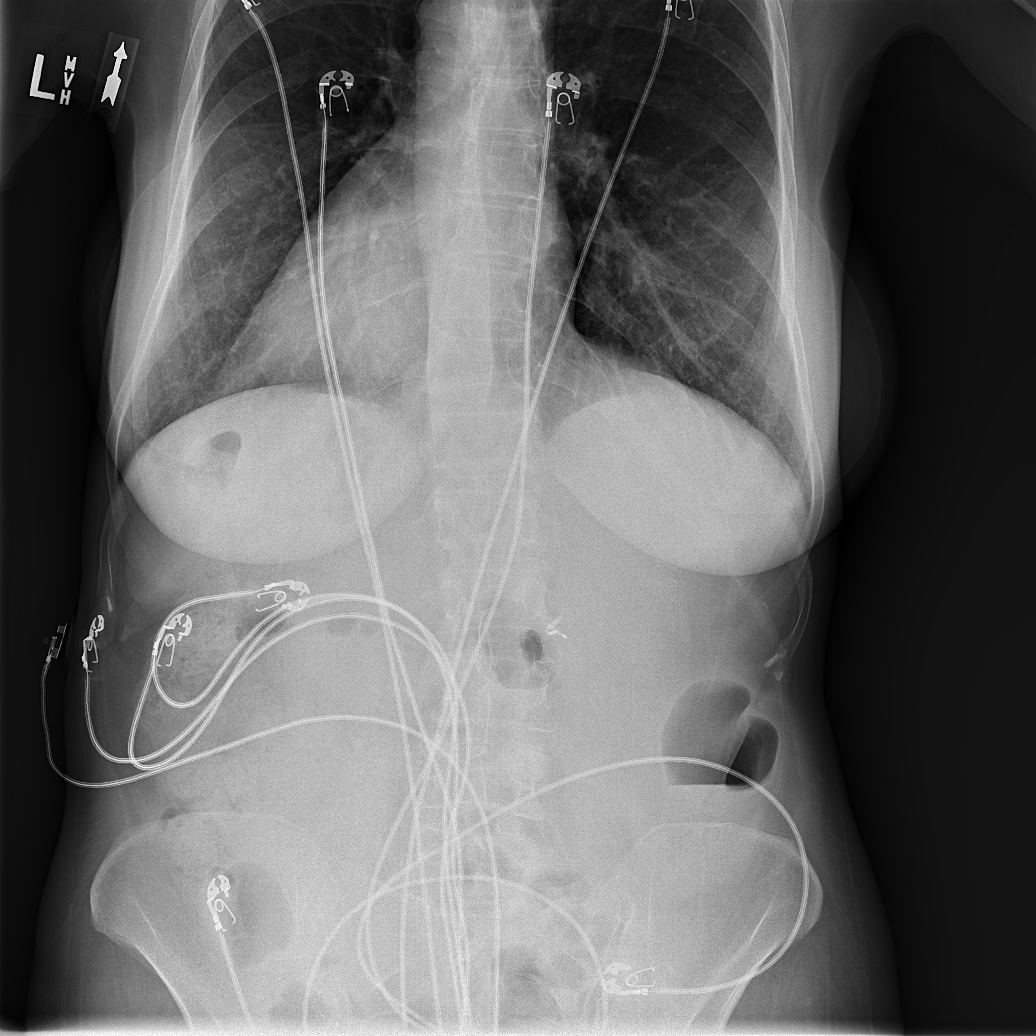

[t abdomen supine (1 of 2)]
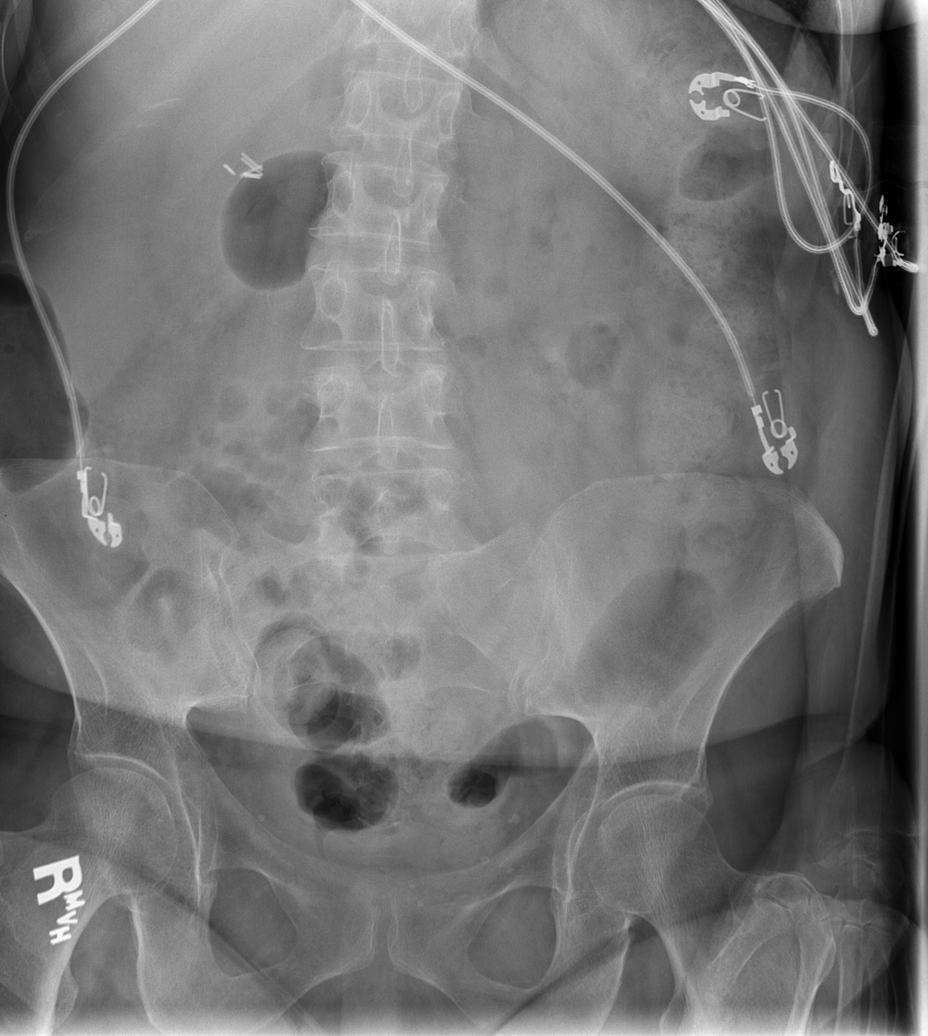

[t abdomen supine (2 of 2)]
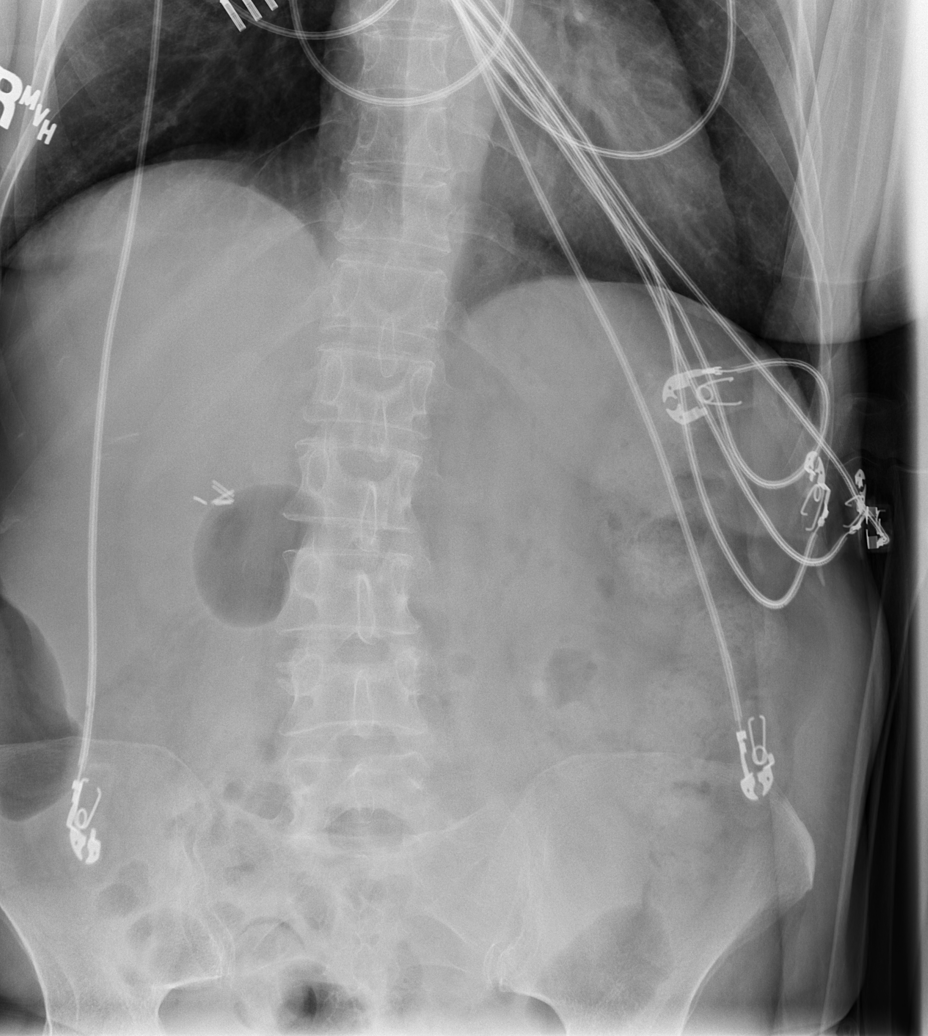

[4 of 4 positions shown; findings below may reference images not displayed]

FINDINGS: Prior cholecystectomy. Nonspecific bowel gas pattern. Gas within
mildly prominent right colon with air-fluid level. No small bowel
distention to suggest small bowel obstruction. No free air
organomegaly. No suspicious calcification.

Heart and mediastinal contours are within normal limits. No focal
opacities or effusions. No acute bony abnormality.
IMPRESSION: Nonspecific bowel gas pattern. Mildly prominent right colon gas with
air-fluid level. This could reflect gastroenteritis. No convincing
evidence for obstruction.

Prior cholecystectomy.

No active cardiopulmonary disease.

## 2019-04-03 DIAGNOSIS — U071 COVID-19: Secondary | ICD-10-CM | POA: Diagnosis not present

## 2019-04-04 DIAGNOSIS — Z1159 Encounter for screening for other viral diseases: Secondary | ICD-10-CM | POA: Diagnosis not present

## 2019-05-22 DIAGNOSIS — R109 Unspecified abdominal pain: Secondary | ICD-10-CM | POA: Diagnosis not present

## 2019-05-22 DIAGNOSIS — K7460 Unspecified cirrhosis of liver: Secondary | ICD-10-CM | POA: Diagnosis not present

## 2019-05-22 DIAGNOSIS — R1310 Dysphagia, unspecified: Secondary | ICD-10-CM | POA: Diagnosis not present

## 2019-05-23 ENCOUNTER — Other Ambulatory Visit: Payer: Self-pay | Admitting: Gastroenterology

## 2019-05-23 DIAGNOSIS — R109 Unspecified abdominal pain: Secondary | ICD-10-CM

## 2019-05-25 ENCOUNTER — Ambulatory Visit
Admission: RE | Admit: 2019-05-25 | Discharge: 2019-05-25 | Disposition: A | Discharge status: Home / Self Care | Payer: Medicare Other | Source: Ambulatory Visit | Attending: Gastroenterology | Admitting: Gastroenterology

## 2019-05-25 DIAGNOSIS — R109 Unspecified abdominal pain: Secondary | ICD-10-CM

## 2019-05-25 DIAGNOSIS — K7460 Unspecified cirrhosis of liver: Secondary | ICD-10-CM | POA: Diagnosis not present

## 2019-05-25 MED ORDER — IOPAMIDOL (ISOVUE-300) INJECTION 61%
100.0000 mL | Freq: Once | INTRAVENOUS | Status: AC | PRN
  Administered 2019-05-25: 100 mL via INTRAVENOUS

## 2019-06-07 DIAGNOSIS — F17210 Nicotine dependence, cigarettes, uncomplicated: Secondary | ICD-10-CM | POA: Diagnosis not present

## 2019-06-07 DIAGNOSIS — S5012XA Contusion of left forearm, initial encounter: Secondary | ICD-10-CM | POA: Diagnosis not present

## 2019-06-07 DIAGNOSIS — Z87898 Personal history of other specified conditions: Secondary | ICD-10-CM | POA: Diagnosis not present

## 2019-06-07 DIAGNOSIS — Z8719 Personal history of other diseases of the digestive system: Secondary | ICD-10-CM | POA: Diagnosis not present

## 2019-06-07 DIAGNOSIS — Z832 Family history of diseases of the blood and blood-forming organs and certain disorders involving the immune mechanism: Secondary | ICD-10-CM | POA: Diagnosis not present

## 2019-06-07 DIAGNOSIS — Z79899 Other long term (current) drug therapy: Secondary | ICD-10-CM | POA: Diagnosis not present

## 2019-06-07 DIAGNOSIS — Z22322 Carrier or suspected carrier of Methicillin resistant Staphylococcus aureus: Secondary | ICD-10-CM | POA: Diagnosis not present

## 2019-06-07 DIAGNOSIS — K7460 Unspecified cirrhosis of liver: Secondary | ICD-10-CM | POA: Diagnosis not present

## 2019-06-07 DIAGNOSIS — G8929 Other chronic pain: Secondary | ICD-10-CM | POA: Diagnosis not present

## 2019-06-07 DIAGNOSIS — T148XXA Other injury of unspecified body region, initial encounter: Secondary | ICD-10-CM | POA: Diagnosis not present

## 2019-06-07 DIAGNOSIS — M7989 Other specified soft tissue disorders: Secondary | ICD-10-CM | POA: Diagnosis not present

## 2019-06-07 DIAGNOSIS — R600 Localized edema: Secondary | ICD-10-CM | POA: Diagnosis not present

## 2019-06-07 DIAGNOSIS — E039 Hypothyroidism, unspecified: Secondary | ICD-10-CM | POA: Diagnosis not present

## 2019-06-07 DIAGNOSIS — L03114 Cellulitis of left upper limb: Secondary | ICD-10-CM | POA: Diagnosis not present

## 2019-06-07 DIAGNOSIS — F1990 Other psychoactive substance use, unspecified, uncomplicated: Secondary | ICD-10-CM

## 2019-06-08 DIAGNOSIS — F1990 Other psychoactive substance use, unspecified, uncomplicated: Secondary | ICD-10-CM | POA: Diagnosis not present

## 2019-06-08 DIAGNOSIS — L03114 Cellulitis of left upper limb: Secondary | ICD-10-CM | POA: Diagnosis not present

## 2019-06-15 DIAGNOSIS — R450 Nervousness: Secondary | ICD-10-CM | POA: Diagnosis not present

## 2019-06-15 DIAGNOSIS — K2100 Gastro-esophageal reflux disease with esophagitis, without bleeding: Secondary | ICD-10-CM | POA: Diagnosis not present

## 2019-06-15 DIAGNOSIS — K7460 Unspecified cirrhosis of liver: Secondary | ICD-10-CM | POA: Diagnosis not present

## 2019-06-21 DIAGNOSIS — G4709 Other insomnia: Secondary | ICD-10-CM | POA: Diagnosis not present

## 2019-06-21 DIAGNOSIS — R450 Nervousness: Secondary | ICD-10-CM | POA: Diagnosis not present

## 2019-06-21 DIAGNOSIS — K7460 Unspecified cirrhosis of liver: Secondary | ICD-10-CM | POA: Diagnosis not present

## 2019-08-22 DIAGNOSIS — K5909 Other constipation: Secondary | ICD-10-CM | POA: Diagnosis not present

## 2019-08-22 DIAGNOSIS — K7460 Unspecified cirrhosis of liver: Secondary | ICD-10-CM | POA: Diagnosis not present

## 2019-08-22 DIAGNOSIS — G4709 Other insomnia: Secondary | ICD-10-CM | POA: Diagnosis not present

## 2019-08-23 DIAGNOSIS — M7062 Trochanteric bursitis, left hip: Secondary | ICD-10-CM | POA: Diagnosis not present

## 2019-10-05 DIAGNOSIS — M25552 Pain in left hip: Secondary | ICD-10-CM | POA: Diagnosis not present

## 2019-10-05 DIAGNOSIS — M79605 Pain in left leg: Secondary | ICD-10-CM | POA: Diagnosis not present

## 2019-10-05 DIAGNOSIS — K7460 Unspecified cirrhosis of liver: Secondary | ICD-10-CM | POA: Diagnosis not present

## 2019-12-27 DIAGNOSIS — J069 Acute upper respiratory infection, unspecified: Secondary | ICD-10-CM | POA: Diagnosis not present

## 2019-12-27 DIAGNOSIS — Z7189 Other specified counseling: Secondary | ICD-10-CM | POA: Diagnosis not present

## 2019-12-27 DIAGNOSIS — R110 Nausea: Secondary | ICD-10-CM | POA: Diagnosis not present

## 2019-12-27 DIAGNOSIS — K2100 Gastro-esophageal reflux disease with esophagitis, without bleeding: Secondary | ICD-10-CM | POA: Diagnosis not present

## 2019-12-27 DIAGNOSIS — M25552 Pain in left hip: Secondary | ICD-10-CM | POA: Diagnosis not present

## 2020-01-11 DIAGNOSIS — G4700 Insomnia, unspecified: Secondary | ICD-10-CM | POA: Diagnosis not present

## 2020-01-11 DIAGNOSIS — K7460 Unspecified cirrhosis of liver: Secondary | ICD-10-CM | POA: Diagnosis not present

## 2020-02-01 DIAGNOSIS — K7460 Unspecified cirrhosis of liver: Secondary | ICD-10-CM | POA: Diagnosis not present

## 2020-02-01 DIAGNOSIS — Z7189 Other specified counseling: Secondary | ICD-10-CM | POA: Diagnosis not present

## 2020-02-15 DIAGNOSIS — K7460 Unspecified cirrhosis of liver: Secondary | ICD-10-CM | POA: Diagnosis not present

## 2020-03-03 DIAGNOSIS — M25552 Pain in left hip: Secondary | ICD-10-CM | POA: Diagnosis not present

## 2020-03-03 DIAGNOSIS — Z7189 Other specified counseling: Secondary | ICD-10-CM | POA: Diagnosis not present

## 2020-03-03 DIAGNOSIS — K7460 Unspecified cirrhosis of liver: Secondary | ICD-10-CM | POA: Diagnosis not present

## 2020-04-02 DIAGNOSIS — R12 Heartburn: Secondary | ICD-10-CM | POA: Diagnosis not present

## 2020-04-02 DIAGNOSIS — Z7189 Other specified counseling: Secondary | ICD-10-CM | POA: Diagnosis not present

## 2020-04-02 DIAGNOSIS — F32A Depression, unspecified: Secondary | ICD-10-CM | POA: Diagnosis not present

## 2020-04-02 DIAGNOSIS — K219 Gastro-esophageal reflux disease without esophagitis: Secondary | ICD-10-CM | POA: Diagnosis not present

## 2020-04-02 DIAGNOSIS — K7460 Unspecified cirrhosis of liver: Secondary | ICD-10-CM | POA: Diagnosis not present

## 2020-06-10 DIAGNOSIS — M5136 Other intervertebral disc degeneration, lumbar region: Secondary | ICD-10-CM | POA: Diagnosis not present

## 2020-06-17 DIAGNOSIS — R69 Illness, unspecified: Secondary | ICD-10-CM | POA: Diagnosis not present

## 2020-06-17 DIAGNOSIS — M5136 Other intervertebral disc degeneration, lumbar region: Secondary | ICD-10-CM | POA: Diagnosis not present

## 2020-06-17 DIAGNOSIS — G894 Chronic pain syndrome: Secondary | ICD-10-CM | POA: Diagnosis not present

## 2020-06-26 DIAGNOSIS — N319 Neuromuscular dysfunction of bladder, unspecified: Secondary | ICD-10-CM | POA: Diagnosis not present

## 2020-06-26 DIAGNOSIS — N3946 Mixed incontinence: Secondary | ICD-10-CM | POA: Diagnosis not present

## 2020-07-01 DIAGNOSIS — M5136 Other intervertebral disc degeneration, lumbar region: Secondary | ICD-10-CM | POA: Diagnosis not present

## 2020-07-01 DIAGNOSIS — G894 Chronic pain syndrome: Secondary | ICD-10-CM | POA: Diagnosis not present

## 2020-07-14 DIAGNOSIS — M5136 Other intervertebral disc degeneration, lumbar region: Secondary | ICD-10-CM | POA: Diagnosis not present

## 2020-07-24 ENCOUNTER — Other Ambulatory Visit: Payer: Self-pay

## 2020-07-24 ENCOUNTER — Ambulatory Visit (INDEPENDENT_AMBULATORY_CARE_PROVIDER_SITE_OTHER): Payer: Medicare HMO | Admitting: Family Medicine

## 2020-07-24 ENCOUNTER — Encounter: Payer: Self-pay | Admitting: Family Medicine

## 2020-07-24 VITALS — BP 120/78 | HR 116 | Temp 97.0°F | Resp 18 | Ht 61.0 in | Wt 105.0 lb

## 2020-07-24 DIAGNOSIS — K7030 Alcoholic cirrhosis of liver without ascites: Secondary | ICD-10-CM

## 2020-07-24 DIAGNOSIS — F1910 Other psychoactive substance abuse, uncomplicated: Secondary | ICD-10-CM | POA: Diagnosis not present

## 2020-07-24 DIAGNOSIS — R10816 Epigastric abdominal tenderness: Secondary | ICD-10-CM | POA: Diagnosis not present

## 2020-07-24 DIAGNOSIS — R202 Paresthesia of skin: Secondary | ICD-10-CM

## 2020-07-24 DIAGNOSIS — R10812 Left upper quadrant abdominal tenderness: Secondary | ICD-10-CM

## 2020-07-24 DIAGNOSIS — R69 Illness, unspecified: Secondary | ICD-10-CM | POA: Diagnosis not present

## 2020-07-24 MED ORDER — OMEPRAZOLE 40 MG PO CPDR: 40.0000 mg | DELAYED_RELEASE_CAPSULE | Freq: Every day | ORAL | 0 refills | Status: DC
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
## 2020-07-24 NOTE — Patient Instructions (Signed)
Start omeprazole.  Call back with names of medicines Go to suboxone clinic in [place].

## 2020-07-29 LAB — CBC WITH DIFFERENTIAL/PLATELET
Basophils Absolute: 0.1 10*3/uL (ref 0.0–0.2)
Basos: 1 %
EOS (ABSOLUTE): 0.5 10*3/uL — ABNORMAL HIGH (ref 0.0–0.4)
Eos: 8 %
Hematocrit: 41.9 % (ref 34.0–46.6)
Hemoglobin: 13.5 g/dL (ref 11.1–15.9)
Immature Grans (Abs): 0 10*3/uL (ref 0.0–0.1)
Immature Granulocytes: 0 %
Lymphocytes Absolute: 2.6 10*3/uL (ref 0.7–3.1)
Lymphs: 43 %
MCH: 27.1 pg (ref 26.6–33.0)
MCHC: 32.2 g/dL (ref 31.5–35.7)
MCV: 84 fL (ref 79–97)
Monocytes Absolute: 0.4 10*3/uL (ref 0.1–0.9)
Monocytes: 7 %
Neutrophils Absolute: 2.6 10*3/uL (ref 1.4–7.0)
Neutrophils: 41 %
Platelets: 304 10*3/uL (ref 150–450)
RBC: 4.99 x10E6/uL (ref 3.77–5.28)
RDW: 12.7 % (ref 11.7–15.4)
WBC: 6.2 10*3/uL (ref 3.4–10.8)

## 2020-07-29 LAB — LIPID PANEL
Chol/HDL Ratio: 2.8 ratio (ref 0.0–4.4)
Cholesterol, Total: 169 mg/dL (ref 100–199)
HDL: 60 mg/dL (ref >39)
LDL Chol Calc (NIH): 89 mg/dL (ref 0–99)
Triglycerides: 113 mg/dL (ref 0–149)
VLDL Cholesterol Cal: 20 mg/dL (ref 5–40)

## 2020-07-29 LAB — COMPREHENSIVE METABOLIC PANEL
ALT: 13 IU/L (ref 0–32)
AST: 14 IU/L (ref 0–40)
Albumin/Globulin Ratio: 1.1 — ABNORMAL LOW (ref 1.2–2.2)
Albumin: 3.9 g/dL (ref 3.8–4.9)
Alkaline Phosphatase: 138 IU/L — ABNORMAL HIGH (ref 44–121)
BUN/Creatinine Ratio: 17 (ref 9–23)
BUN: 15 mg/dL (ref 6–24)
Bilirubin Total: 0.2 mg/dL (ref 0.0–1.2)
CO2: 19 mmol/L — ABNORMAL LOW (ref 20–29)
Calcium: 8.8 mg/dL (ref 8.7–10.2)
Chloride: 101 mmol/L (ref 96–106)
Creatinine, Ser: 0.9 mg/dL (ref 0.57–1.00)
Globulin, Total: 3.7 g/dL (ref 1.5–4.5)
Glucose: 95 mg/dL (ref 65–99)
Potassium: 3.9 mmol/L (ref 3.5–5.2)
Sodium: 137 mmol/L (ref 134–144)
Total Protein: 7.6 g/dL (ref 6.0–8.5)
eGFR: 75 mL/min/{1.73_m2} (ref >59)

## 2020-07-29 LAB — CARDIOVASCULAR RISK ASSESSMENT

## 2020-07-29 LAB — B12 AND FOLATE PANEL
Folate: 5.8 ng/mL (ref >3.0)
Vitamin B-12: 492 pg/mL (ref 232–1245)

## 2020-07-29 LAB — METHYLMALONIC ACID, SERUM: Methylmalonic Acid: 439 nmol/L — ABNORMAL HIGH (ref 0–378)

## 2020-07-29 LAB — H PYLORI, IGM, IGG, IGA AB
H pylori, IgM Abs: 19 units — ABNORMAL HIGH (ref 0.0–8.9)
H. pylori, IgA Abs: 10.2 units — ABNORMAL HIGH (ref 0.0–8.9)
H. pylori, IgG AbS: 0.31 Index Value (ref 0.00–0.79)

## 2020-07-29 LAB — TSH: TSH: 7.76 u[IU]/mL — ABNORMAL HIGH (ref 0.450–4.500)

## 2020-07-30 ENCOUNTER — Other Ambulatory Visit: Payer: Self-pay

## 2020-07-30 DIAGNOSIS — A048 Other specified bacterial intestinal infections: Secondary | ICD-10-CM

## 2020-07-30 MED ORDER — LEVOTHYROXINE SODIUM 50 MCG PO TABS: 50.0000 ug | ORAL_TABLET | Freq: Every day | ORAL | 3 refills | Status: DC

## 2020-07-30 MED ORDER — AMOXICILL-CLARITHRO-LANSOPRAZ PO MISC: Freq: Two times a day (BID) | ORAL | 0 refills | Status: DC

## 2020-07-31 ENCOUNTER — Other Ambulatory Visit: Payer: Self-pay

## 2020-07-31 DIAGNOSIS — E039 Hypothyroidism, unspecified: Secondary | ICD-10-CM

## 2020-07-31 DIAGNOSIS — A048 Other specified bacterial intestinal infections: Secondary | ICD-10-CM

## 2020-07-31 DIAGNOSIS — K7030 Alcoholic cirrhosis of liver without ascites: Secondary | ICD-10-CM

## 2020-08-01 ENCOUNTER — Telehealth: Payer: Self-pay | Admitting: Family Medicine

## 2020-08-01 NOTE — Progress Notes (Signed)
  Chronic Care Management   Note  08/01/2020 Name: KAFI DOTTER MRN: [id number] DOB: 11-Dec-1965  [name] is a 55 y.o. year old female who is a primary care patient of Cox, Kirsten, MD. I reached out to Jeannie Done by phone today in response to a referral sent by Ms. Glynis Smiles Dahle's PCP, Cox, Kirsten, MD.   Ms. Noguera was given information about Chronic Care Management services today including:  1. CCM service includes personalized support from designated clinical staff supervised by her physician, including individualized plan of care and coordination with other care providers 2. 24/7 contact phone numbers for assistance for urgent and routine care needs. 3. Service will only be billed when office clinical staff spend 20 minutes or more in a month to coordinate care. 4. Only one practitioner may furnish and bill the service in a calendar month. 5. The patient may stop CCM services at any time (effective at the end of the month) by phone call to the office staff.   Patient agreed to services and verbal consent obtained.   Follow up plan:   Carley Perdue [employer] Scheduler

## 2020-08-08 ENCOUNTER — Encounter: Payer: Self-pay | Admitting: Family Medicine

## 2020-08-08 NOTE — Progress Notes (Signed)
Chronic Care Management Pharmacy Note  08/21/2020 Name:  Jamie [name] MRN:  [id number] DOB:  Apr 23, 1966   Plan Updates:   Patient has not started treatment for H. Pylori but is going to pick up medication today after our appointment. Update: Pharmacist's assistant spoke with patient confirming she had picked up and started treatment.   Patient is not taking medications from [facility] provider despite blister packaging and in her possession. Have encouraged patient to resume therapy and follow-up with [facility].   Patient advised to schedule an appointment to evaluate back pain if persists. Provided patient with the after hours call line.   Patient has not established with a substance abuse provider at this time. Encouraged patient to schedule a visit. Patient states she plans to start with a methadone clinic soon.   Subjective: Jamie [name] is an 55 y.o. year old female who is a primary patient of Cox, Kirsten, MD.  The CCM team was consulted for assistance with disease management and care coordination needs.    Engaged with patient by telephone for initial visit in response to provider referral for pharmacy case management and/or care coordination services.   Consent to Services:  The patient was given the following information about Chronic Care Management services today, agreed to services, and gave verbal consent: 1. CCM service includes personalized support from designated clinical staff supervised by the primary care provider, including individualized plan of care and coordination with other care providers 2. 24/7 contact phone numbers for assistance for urgent and routine care needs. 3. Service will only be billed when office clinical staff spend 20 minutes or more in a month to coordinate care. 4. Only one practitioner may furnish and bill the service in a calendar month. 5.The patient may stop CCM services at any time (effective at the end of the month) by phone call to the office  staff. 6. The patient will be responsible for cost sharing (co-pay) of up to 20% of the service fee (after annual deductible is met). Patient agreed to services and consent obtained.  Patient Care Team: Rochel Brome, MD as PCP - General (Family Medicine) Default, Provider, MD (Orthopedic Surgery) Burnice Logan, [facility] as Pharmacist (Pharmacist)  Recent office visits:  08/01/2020: Rochel Brome, MD (PCP) / No medication changes noted  07/24/1964: Rochel Brome, MD (PCP) / Discontinued Cyclobenzaprine, Gabapentin and Oxycodone. Start Omeprazole. 04/02/2020: Manjeet Adine Madura (PCP)/ No medication changes noted 03/03/2020: Manjeet Adine Madura (PCP)/ No medication changes noted 0/22/2021: Manjeet Adine Madura (PCP)/ No medication changes noted  Recent consult visits:  No consult visits within the past 6 months noted  Hospital visits:  No hospital visits within the past 6 months noted  Objective:  Lab Results  Component Value Date   CREATININE 0.90 07/24/2020   BUN 15 07/24/2020   GFRNONAA >60 12/04/2017   GFRAA >60 12/04/2017   NA 137 07/24/2020   K 3.9 07/24/2020   CALCIUM 8.8 07/24/2020   CO2 19 (L) 07/24/2020   GLUCOSE 95 07/24/2020    No results found for: HGBA1C, FRUCTOSAMINE, GFR, MICROALBUR  Last diabetic Eye exam: No results found for: HMDIABEYEEXA  Last diabetic Foot exam: No results found for: HMDIABFOOTEX   Lab Results  Component Value Date   CHOL 169 07/24/2020   HDL 60 07/24/2020   LDLCALC 89 07/24/2020   TRIG 113 07/24/2020   CHOLHDL 2.8 07/24/2020    Hepatic Function Latest Ref Rng & Units 07/24/2020 12/02/2017 12/01/2017  Total Protein 6.0 - 8.5  g/dL 7.6 5.9(L) 6.1(L)  Albumin 3.8 - 4.9 g/dL 3.9 2.5(L) 2.4(L)  AST 0 - 40 IU/L 14 48(H) 76(H)  ALT 0 - 32 IU/L 13 27 38  Alk Phosphatase 44 - 121 IU/L 138(H) 174(H) 222(H)  Total Bilirubin 0.0 - 1.2 mg/dL 0.2 0.7 1.2  Bilirubin, Direct 0.0 - 0.2 mg/dL - - 0.3(H)    Lab Results  Component Value Date/Time    TSH 7.760 (H) 07/24/2020 11:10 AM    CBC Latest Ref Rng & Units 07/24/2020 12/04/2017 12/03/2017  WBC 3.4 - 10.8 x10E3/uL 6.2 8.0 4.2  Hemoglobin 11.1 - 15.9 g/dL 13.5 14.4 12.2  Hematocrit 34.0 - 46.6 % 41.9 44.1 36.9  Platelets 150 - 450 x10E3/uL 304 159 140(L)    No results found for: VD25OH  Clinical ASCVD: No  The 10-year ASCVD risk score Mikey Bussing DC Jr., et al., 2013) is: 1.6%   Values used to calculate the score:     Age: 63 years     Sex: Female     Is Non-Hispanic African American: No     Diabetic: No     Tobacco smoker: No     Systolic Blood Pressure: 468 mmHg     Is BP treated: Yes     HDL Cholesterol: 60 mg/dL     Total Cholesterol: 169 mg/dL    No flowsheet data found.    Social History   Tobacco Use  Smoking Status Never Smoker  Smokeless Tobacco Never Used   BP Readings from Last 3 Encounters:  07/24/20 120/78  12/04/17 127/67  09/17/17 114/75   Pulse Readings from Last 3 Encounters:  07/24/20 (!) 116  12/04/17 99  09/17/17 74   Wt Readings from Last 3 Encounters:  07/24/20 105 lb (47.6 kg)  12/04/17 127 lb 10.3 oz (57.9 kg)  09/14/17 118 lb 12.8 oz (53.9 kg)   BMI Readings from Last 3 Encounters:  07/24/20 19.84 kg/m  12/04/17 21.91 kg/m  09/14/17 20.39 kg/m    Assessment/Interventions: Review of patient past medical history, allergies, medications, health status, including review of consultants reports, laboratory and other test data, was performed as part of comprehensive evaluation and provision of chronic care management services.   SDOH:  (Social Determinants of Health) assessments and interventions performed: Yes SDOH Interventions   Flowsheet Row Most Recent Value  SDOH Interventions   Housing Interventions Other (Comment)  [Patient states [facility] is assisting with this]     SDOH Screenings   Alcohol Screen: Not on file  Depression (EHO1-2): Not on file  Financial Resource Strain: Not on file  Food Insecurity: Not on file   Housing: Medium Risk  . Last Housing Risk Score: 1  Physical Activity: Not on file  Social Connections: Not on file  Stress: Not on file  Tobacco Use: Low Risk   . Smoking Tobacco Use: Never Smoker  . Smokeless Tobacco Use: Never Used  Transportation Needs: Unmet Transportation Needs  . Lack of Transportation (Medical): Yes  . Lack of Transportation (Non-Medical): Yes    CCM Care Plan  Allergies  Allergen Reactions  . Codeine     Medications Reviewed Today    Reviewed by Burnice Logan, [facility] (Pharmacist) on 08/15/20 at [place] List Status: <None>  Medication Order Taking? Sig Documenting Provider Last Dose Status Informant  albuterol (PROVENTIL HFA;VENTOLIN HFA) 108 (90 Base) MCG/ACT inhaler [id number] No Inhale 2 puffs into the lungs every 6 (six) hours as needed for wheezing or shortness of breath.  Patient not taking: Reported on 08/15/2020   [provider] Not Taking Active Self  amoxicillin-clarithromycin-lansoprazole [facility]) combo pack [id number] Yes Take by mouth 2 (two) times daily for 14 days. Follow package directions. Rochel Brome, MD Taking Expired 08/13/20 2359   ARIPiprazole (ABILIFY) 5 MG tablet [id number] No Take 5 mg by mouth daily.  Patient not taking: Reported on 08/15/2020   [provider] Not Taking Active   busPIRone (BUSPAR) 10 MG tablet [id number] No Take 10 mg by mouth 2 (two) times daily.  Patient not taking: Reported on 08/15/2020   [provider] Not Taking Active   carvedilol (COREG) 12.5 MG tablet [id number] No 12.5 mg daily.  Patient not taking: Reported on 08/15/2020   [provider] Not Taking Active   DULoxetine (CYMBALTA) 20 MG capsule [id number] No Take 20 mg by mouth daily.  Patient not taking: Reported on 08/15/2020   [provider] Not Taking Active   escitalopram (LEXAPRO) 20 MG tablet [id number] No Take 1 tablet by mouth daily.  Patient not taking: Reported on 08/15/2020   [provider] Not Taking Active   gabapentin (NEURONTIN) 300 MG capsule [id number] Yes Take 1 capsule by mouth 3 (three) times daily. [provider] Taking Active   Ginger, Zingiber officinalis, (GINGER PO) [id number] No Take 1 capsule by mouth daily.  Patient not taking: Reported on 08/15/2020   [provider] Not Taking Active Self  hydrOXYzine (ATARAX/VISTARIL) 25 MG tablet [id number] No SMARTSIG:1 Tablet(s) By Mouth Every 12 Hours PRN  Patient not taking: Reported on 08/15/2020   [provider] Not Taking Active   levothyroxine (SYNTHROID) 50 MCG tablet [id number] No Take 1 tablet (50 mcg total) by mouth daily.  Patient not taking: Reported on 08/15/2020   Rochel Brome, MD Not Taking Active   Milk Thistle 250 MG CAPS [id number] No Take 250 mg by mouth daily.  Patient not taking: Reported on 08/15/2020   [provider] Not Taking Active Self  omeprazole (PRILOSEC) 40 MG capsule [id number] Yes Take 40 mg by mouth 2 (two) times daily. [provider] Taking Active           Patient Active Problem List   Diagnosis Date Noted  . Hypotension 12/02/2017  . Acute GI bleeding 12/01/2017  . Essential hypertension 12/01/2017  . Colitis 09/15/2017  . Chest wall pain 09/15/2017  . Abdominal pain 09/14/2017  . Dehydration 07/12/2017  . Acute lower UTI 07/12/2017  . Nausea & vomiting 07/12/2017  . Acute bronchitis 04/21/2017  . Hepatic cirrhosis ([place]) 04/21/2017  . Opioid use disorder 04/21/2017    There is no immunization history for the selected administration types on file for this patient.  Conditions to be addressed/monitored:  Hypertension and hepatic cirrhosis, GERD, depression, bipolar disorder.   Care Plan : [employer]  Updates made by Burnice Logan, RPH since 08/21/2020 12:00 AM    Problem: bipolar, gerd, depression, hypothyroidism   Priority: High  Onset Date: 08/18/2020    Long-Range Goal: Disease State Management   Start Date:  08/18/2020  Expected End Date: 08/18/2021  This Visit's Progress: On track  Priority: High  Note:    Current Barriers:  . Unable to self administer medications as prescribed . Does not adhere to prescribed medication regimen  Pharmacist Clinical Goal(s):  Marland Kitchen Patient will achieve ability to self administer medications as prescribed through use of packaging  as evidenced by patient report . adhere to prescribed medication regimen as  evidenced by fill history and symptom management through collaboration with PharmD and provider.    Interventions: . 1:1 collaboration with Rochel Brome, MD regarding development and update of comprehensive plan of care as evidenced by provider attestation and co-signature . Inter-disciplinary care team collaboration (see longitudinal plan of care) . Comprehensive medication review performed; medication list updated in electronic medical record  Hypertension (BP goal <140/90) -Controlled -Current treatment: . Not currently taking medication  -Medications previously tried: carvedilol -Current home readings: not checking or taking medication -Current dietary habits:  "eats enough to not pass out", eats about every other day. Cereal or sandwich. Very little appetite and has a lot of pain.  -Current exercise habits: n/a -Denies hypotensive/hypertensive symptoms -Educated on Daily salt intake goal < 2300 mg; Exercise goal of 150 minutes per week; -Counseled to monitor BP at home as needed, document, and provide log at future appointments -Counseled on diet and exercise extensively  Depression/Anxiety - managed by mental health  (Goal: begin taking medication as prescribed) -Uncontrolled -Current treatment: . abilify 5 mg daily - not currently taking  . Buspirone 10 mg bid . Duloxetine 20 mg daily -not currently taking . escitalopram 20 mg daily - not currently taking  . Hydroxyzine 24 mg every 12 hours prn  -Medications previously tried/failed:  lamotrigine, mirtazapine,  -Connected with [facility] for mental health support -Educated on Benefits of medication for symptom control Benefits of cognitive-behavioral therapy with or without medication -Counseled on following up with mental health professional. Discussed talking to provider at [facility] about resuming medications. Encouraged to continut to attend all scheduled appointments.   Hypothyroidism (Goal: manage symptoms of hypothyroidism) -Not ideally controlled -Current treatment  . Levothyroxine 50 mcg daily - has not started medication yet -Medications previously tried: none reported  -Recommended to continue current medication  GERD/H. Pylori (Goal: manage symptoms and adhere to medication as prescribed) -Not ideally controlled -Current treatment  . omeprazole 40 mg bid - hold when taking Prevpac . Prevpac for H. Pylori as directed on package - patient had not picked up as of 04/22 -Medications previously tried: none reported  -Recommended pick up and begin taking Prevpac per directions. Discussed not taking omeprazole at same time. Recommended to take on an empty stomach as indicated in the package insert.   Pain (Goal: manage pain) -Not ideally controlled -Current treatment  . Gabapentin 300 mg tid  -Medications previously tried: none reported  -Recommended to continue current medication  Health Maintenance -Vaccine gaps: no immunization record available. Will discuss at future visits.  -Current therapy:  N/a  Patient Goals/Self-Care Activities . Patient will:  - take medications as prescribed focus on medication adherence by beginning to take blister packed medications as prescribed by [facility]. Encouraged patient to contact provider at [facility] with any questions. Encouraged patient to schedule visit with Suboxone clinic.   Follow Up Plan: Telephone follow up appointment with care management team member scheduled for: 09/2020      Medication Assistance: None  required.  Patient affirms current coverage meets needs.  Patient's preferred pharmacy is:  [id number] [address], [address] AT [address] [place] [address] Phone: [phone] Fax: [phone]  Uses pill box? Yes - patient in packaging contract with mental health provider  Pt endorses poor compliance  We discussed: Benefits of medication synchronization, packaging and delivery as well as enhanced pharmacist oversight with [employer]. Patient decided to: Continue current medication management  strategy  Care Plan and Follow Up Patient Decision:  Patient agrees to Care Plan and Follow-up.  Plan: Telephone follow up appointment with care management team member scheduled for:  09/2020

## 2020-08-11 ENCOUNTER — Telehealth: Payer: Self-pay

## 2020-08-11 NOTE — Progress Notes (Addendum)
Chronic Care Management Pharmacy Assistant   Name: Jamie [name]  MRN: [id number] DOB: Oct 17, 1965  Jamie [name] is an 55 y.o. year old female who presents for his initial CCM visit with the clinical pharmacist.  Reason for Encounter: Initial questions    Recent office visits:  08/01/2020: Rochel Brome, MD (PCP) / No medication changes noted  07/24/1964: Rochel Brome, MD (PCP) / Discontinued Cyclobenzaprine, Gabapentin and Oxycodone. Start Omeprazole. 04/02/2020: Manjeet Adine Madura (PCP)/ No medication changes noted 03/03/2020: Manjeet Adine Madura (PCP)/ No medication changes noted 0/22/2021: Manjeet Adine Madura (PCP)/ No medication changes noted  Recent consult visits:  No consult visits within the past 6 months noted  Hospital visits:  No hospital visits within the past 6 months noted  Medications: Outpatient Encounter Medications as of 08/11/2020  Medication Sig   albuterol (PROVENTIL HFA;VENTOLIN HFA) 108 (90 Base) MCG/ACT inhaler Inhale 2 puffs into the lungs every 6 (six) hours as needed for wheezing or shortness of breath.   amoxicillin-clarithromycin-lansoprazole (PREVPAC) combo pack Take by mouth 2 (two) times daily for 14 days. Follow package directions.   ARIPiprazole (ABILIFY) 5 MG tablet Take 5 mg by mouth daily.   carvedilol (COREG) 12.5 MG tablet 12.5 mg daily.   Ginger, Zingiber officinalis, (GINGER PO) Take 1 capsule by mouth daily.   levothyroxine (SYNTHROID) 50 MCG tablet Take 1 tablet (50 mcg total) by mouth daily.   Milk Thistle 250 MG CAPS Take 250 mg by mouth daily.   No facility-administered encounter medications on file as of 08/11/2020.  Have you seen any other providers since your last visit? Patient stated that she has not seen any other providers since her last visit.   Any changes in your medications or health?  Patient stated that she has not had any medication or health changes recently.  Any side effects from any  medications? Patient stated that Prevpac is making her nauseated. Patient boyfriend Lennette Bihari) stated that he "cannot get her to eat" at all, and reported that he thinks that is why medicine is making her nauseated because she will not eat after she takes it.   Do you have an symptoms or problems not managed by your medications? Patient boyfriend stated that he is concerned about her lack of appetite and weight loss.    Any concerns about your health right now?  Patient stated that she feels like she has a UTI. I spoke with Sherre Poot, CCP who recommended contacting the office for a visit, increasing water consumption, and drinking cranberry juice.   Has your provider asked that you check blood pressure, blood sugar, or follow special diet at home?  Patient's boyfriend Lennette Bihari stated that she does not check her blood pressure and that she "hardly eats anything". Per Sherre Poot, CCP advised to supplement her diet with Ensure nutritional drinks to increase caloric intake.   Do you get any type of exercise on a regular basis?   Patient stated that she does not exercise.   Can you think of a goal you would like to reach for your health?  Patient's boyfriend Lennette Bihari) stated that she wanted to do a better job of taking her medications as prescribed.   Do you have any problems getting your medications?  Patient stated that she does not have any problems getting her medications.  Is there anything that you would like to discuss during the appointment? Patient stated that there is nothing that she needs to discuss during next appointment.  Please have medications and supplements to review with Sherre Poot, Pharm. D., to her next telephone appointment on Monday, 10/20/2020 @ 12:30 pm.  Marcine Matar, [place] Clinical Pharmacist Assistant

## 2020-08-15 ENCOUNTER — Other Ambulatory Visit: Payer: Self-pay

## 2020-08-15 ENCOUNTER — Telehealth: Payer: Medicare HMO

## 2020-08-15 ENCOUNTER — Ambulatory Visit (INDEPENDENT_AMBULATORY_CARE_PROVIDER_SITE_OTHER): Payer: Medicare HMO

## 2020-08-15 DIAGNOSIS — E039 Hypothyroidism, unspecified: Secondary | ICD-10-CM

## 2020-08-15 DIAGNOSIS — I10 Essential (primary) hypertension: Secondary | ICD-10-CM | POA: Diagnosis not present

## 2020-08-15 DIAGNOSIS — A048 Other specified bacterial intestinal infections: Secondary | ICD-10-CM

## 2020-08-16 ENCOUNTER — Telehealth: Payer: Self-pay | Admitting: Nurse Practitioner

## 2020-08-16 NOTE — Telephone Encounter (Signed)
Person called after hours call number at 0430 am stated his name was Jamie [name] was a friend of Jamie [name]. Stated she was experiencing severe abdominal pain and was recently prescribed medications for H. Pylori treatment. Recommended Jamie [name] go to nearest ED.   Jamie [name] called after hours phone approximately 12 hours later stating pt did not go to ED as recommended. Jamie [name] stated he instructed Jamie [name] not to take prescribed medications for H. Pylori treatment. Recommended pt seek emergency medical care at closest ED for the second time. Education given concerning H.pylori and treatment. Jamie [name] requested return phone call on Monday from PCP. There is not a Jamie [name] listed on patient HIPPA form to discuss medical matters.

## 2020-08-21 ENCOUNTER — Telehealth: Payer: Self-pay

## 2020-08-21 DIAGNOSIS — R1010 Upper abdominal pain, unspecified: Secondary | ICD-10-CM | POA: Diagnosis not present

## 2020-08-21 DIAGNOSIS — F3189 Other bipolar disorder: Secondary | ICD-10-CM | POA: Diagnosis not present

## 2020-08-21 DIAGNOSIS — E039 Hypothyroidism, unspecified: Secondary | ICD-10-CM | POA: Diagnosis not present

## 2020-08-21 DIAGNOSIS — K2970 Gastritis, unspecified, without bleeding: Secondary | ICD-10-CM | POA: Diagnosis not present

## 2020-08-21 DIAGNOSIS — R69 Illness, unspecified: Secondary | ICD-10-CM | POA: Diagnosis not present

## 2020-08-21 DIAGNOSIS — N12 Tubulo-interstitial nephritis, not specified as acute or chronic: Secondary | ICD-10-CM | POA: Diagnosis not present

## 2020-08-21 DIAGNOSIS — R1110 Vomiting, unspecified: Secondary | ICD-10-CM | POA: Diagnosis not present

## 2020-08-21 DIAGNOSIS — I10 Essential (primary) hypertension: Secondary | ICD-10-CM | POA: Diagnosis not present

## 2020-08-21 DIAGNOSIS — F1010 Alcohol abuse, uncomplicated: Secondary | ICD-10-CM | POA: Diagnosis not present

## 2020-08-21 DIAGNOSIS — G8929 Other chronic pain: Secondary | ICD-10-CM | POA: Diagnosis not present

## 2020-08-21 DIAGNOSIS — F1990 Other psychoactive substance use, unspecified, uncomplicated: Secondary | ICD-10-CM | POA: Diagnosis not present

## 2020-08-21 DIAGNOSIS — M545 Low back pain, unspecified: Secondary | ICD-10-CM | POA: Diagnosis not present

## 2020-08-21 DIAGNOSIS — R1084 Generalized abdominal pain: Secondary | ICD-10-CM | POA: Diagnosis not present

## 2020-08-21 DIAGNOSIS — Z79899 Other long term (current) drug therapy: Secondary | ICD-10-CM | POA: Diagnosis not present

## 2020-08-21 NOTE — Telephone Encounter (Signed)
Pt calling stating she is in severe pain. States it is abdominal pain and she was diagnosed with H. Pylori. States [facility] was there and they believed she needed to go to hospital but she wanted Dr. Alyse Low advise. Pt then proceeded to state she has had a fever with most recent at 103.5 and highest at 105.1. Pt was at that time instructed to go to hospital. Pt states she does not want to wait in ER waiting room and requested we call ahead and have them bring her straight back. Pt states that she is weak and has had syncopal episodes. Last one earlier this morning. Did question when pt last used drugs and pt stated 3 am this morning. Pt again was instructed to go to hospital and that is what we recommended. Pt stated she would do what Dr. Tobie Poet recommended and again stated this is what she recommends. Pt elected to go by father instead of ambulance. Did advise pt with syncopal episodes and high fever ambulance may be best choice. Pt wanted father to take her. Pt stated she would take a shower with help of [facility] and get ready. Advised pt not to shower, just to put on clothes and proceed to hospital. Pt VU and stated she would go to hospital. Spoke with [facility] personnel in room and advised pt to get dressed. Also spoke with father. Advised father pt elected to go to hospital by him. Stated she needed to go to closest hospital. Father VU and stated he would take her.   Royce Macadamia, [place] 08/21/20 12:07 PM

## 2020-08-21 NOTE — Patient Instructions (Signed)
Helicobacter Pylori Infection Helicobacter pylori infection is a bacterial infection in the stomach. Long-term (chronic) infection can cause stomach irritation (gastritis), ulcers in the stomach (gastric ulcers), and ulcers in the upper part of the intestine (duodenal ulcers). Having this infection may also increase your risk of stomach cancer and a type of white blood cell cancer (lymphoma) that affects the stomach. What are the causes? This infection is caused by the Helicobacter pylori (H. pylori) bacteria. Many healthy people have this bacteria in their stomach lining. The bacteria may also spread from person to person through contact with stool (feces) or saliva. It is not known why some people develop ulcers, gastritis, or cancer from the bacteria. What increases the risk? You are more likely to develop this condition if you:  Have family members with the infection.  Live with many other people, such as in a dormitory.  Are of African, Hispanic, or Asian descent. What are the signs or symptoms? Most people with this infection do not have any symptoms. If you do have symptoms, they may include:  Heartburn.  Stomach pain.  Nausea.  Vomiting. The vomit may be bloody because of ulcers.  Loss of appetite.  Bad breath. How is this diagnosed? This condition may be diagnosed based on:  Your symptoms and medical history.  A physical exam.  Blood tests.  Stool tests.  A breath test.  A procedure that involves placing a tube with a camera on the end of it down your throat to examine your stomach and upper intestine (upper endoscopy).  Removing and testing a tissue sample from the stomach lining (biopsy). A biopsy may be taken during an upper endoscopy. How is this treated? This condition is treated by taking a combination of medicines (triple therapy) for several weeks. Triple therapy includes one medicine to reduce the amount of acid in your stomach and two types of antibiotic  medicines. This treatment may reduce your risk of cancer. You may need to be tested for H. pylori again after treatment. In some cases, the treatment may need to be repeated if your treatment did not get rid of all the bacteria.   Follow these instructions at home:  Take over-the-counter and prescription medicines only as told by your health care provider.  Take your antibiotics as told by your health care provider. Do not stop taking the antibiotics even if you start to feel better.  Return to your normal activities as told by your health care provider. Ask your health care provider what activities are safe for you.  Take steps to prevent future infections: ? Wash your hands often with soap and water. If soap and water are not available, use hand sanitizer. ? Do not eat food or drink water that may have had contact with stool or saliva.  Keep all follow-up visits as told by your health care provider. This is important. You may need tests to make sure your treatment worked.   Contact a health care provider if your symptoms:  Do not get better with treatment.  Return after treatment. Summary  Helicobacter pylori infection is a stomach infection caused by the Helicobacter pylori (H. pylori) bacteria.  This infection can cause stomach irritation (gastritis), ulcers in the stomach (gastric ulcers), and ulcers in the upper part of the intestine (duodenal ulcers).  This condition is treated by taking a combination of medicines (triple therapy) for several weeks.  Take your antibiotics as told by your health care provider. Do not stop taking the antibiotics   even if you start to feel better. This information is not intended to replace advice given to you by your health care provider. Make sure you discuss any questions you have with your health care provider. Document Revised: 08/03/2018 Document Reviewed: 04/05/2017 Elsevier Patient Education  2021 Elsevier Inc.  

## 2020-08-21 NOTE — Telephone Encounter (Signed)
Reviewed and agree with plan.

## 2020-08-22 DIAGNOSIS — K2970 Gastritis, unspecified, without bleeding: Secondary | ICD-10-CM | POA: Diagnosis not present

## 2020-08-22 DIAGNOSIS — R69 Illness, unspecified: Secondary | ICD-10-CM | POA: Diagnosis not present

## 2020-08-22 DIAGNOSIS — R1084 Generalized abdominal pain: Secondary | ICD-10-CM | POA: Diagnosis not present

## 2020-09-02 ENCOUNTER — Ambulatory Visit (INDEPENDENT_AMBULATORY_CARE_PROVIDER_SITE_OTHER): Payer: Medicare HMO | Admitting: Family Medicine

## 2020-09-02 ENCOUNTER — Other Ambulatory Visit: Payer: Self-pay

## 2020-09-02 VITALS — BP 120/76 | HR 96 | Temp 97.4°F | Resp 16 | Ht 63.3 in | Wt 108.2 lb

## 2020-09-02 DIAGNOSIS — K7030 Alcoholic cirrhosis of liver without ascites: Secondary | ICD-10-CM | POA: Diagnosis not present

## 2020-09-02 DIAGNOSIS — F3160 Bipolar disorder, current episode mixed, unspecified: Secondary | ICD-10-CM | POA: Diagnosis not present

## 2020-09-02 DIAGNOSIS — F1190 Opioid use, unspecified, uncomplicated: Secondary | ICD-10-CM | POA: Diagnosis not present

## 2020-09-02 DIAGNOSIS — A048 Other specified bacterial intestinal infections: Secondary | ICD-10-CM

## 2020-09-02 DIAGNOSIS — R69 Illness, unspecified: Secondary | ICD-10-CM | POA: Diagnosis not present

## 2020-09-02 MED ORDER — OMEPRAZOLE 40 MG PO CPDR: 40.0000 mg | DELAYED_RELEASE_CAPSULE | Freq: Two times a day (BID) | ORAL | 1 refills | Status: AC

## 2020-09-02 MED ORDER — CALCIUM CITRATE-VITAMIN D 500-500 MG-UNT/5GM PO POWD: 500.0000 mg | Freq: Two times a day (BID) | ORAL | 3 refills | Status: AC
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
## 2020-09-02 NOTE — Patient Instructions (Addendum)
Call for inpatient drug rehab Increase omeprazole to 40 mg one twice daily.  Recommend calcium citrate with D one twice a day.  No ibuprofen, aleve, or goodies. No tylenol.  Stop alcohol.  Wean off heroin.  Call Dr. Paulita Fujita. Needs appointment for follow up.  Call Dr. Barnetta Chapel. Needs appointment for follow up.

## 2020-09-04 ENCOUNTER — Telehealth: Payer: Self-pay

## 2020-09-04 NOTE — Progress Notes (Signed)
Chronic Care Management Pharmacy Assistant   Name: Jamie [name]  MRN: [id number] DOB: 1965/05/17  Reason for Encounter: Disease State for General Adherence  Recent office visits:  09/02/20-Jamie [name] PCP, abdominal pain, start Calcium Citrate, patient not taking Albuterol, Abilify, Buspar, Carvedilol, Cymbalta, Lexapro, Gabapentin, Hydroxyzine, all due to non compliance.  Call for inpatient drug rehab Increase omeprazole to 40 mg one twice daily.  Recommend calcium citrate with D one twice a day.  No ibuprofen, aleve, or goodies. No tylenol.  Stop alcohol.  Wean off heroin.  Call Dr. Paulita [name]. Needs appointment for follow up.  Call Dr. Barnetta [name]. Needs appointment for follow up.   08/15/20-Jamie [name], [place], Patient has not started treatment for H. Pylori but is going to pick up medication today after our appointment. Update: Pharmacist's assistant spoke with patient confirming she had picked up and started treatment.  Patient is not taking medications from [facility] provider despite blister packaging and in her possession. Have encouraged patient to resume therapy and follow-up with [facility].  Patient advised to schedule an appointment to evaluate back pain if persists. Provided patient with the after hours call line.  Patient has not established with a substance abuse provider at this time. Encouraged patient to schedule a visit. Patient states she plans to start with a methadone clinic soon.   07/24/20-Labs, CBC: Normal CMP: liver and kidney function normal TSH: elevated, start patient taking Synthroid 32mcg once in the morning and send 30 days with 2 refills. Recheck TSH in 6 weeks Hpylori positive for current acute infection and patient needs to be treated with Prev pack which is being sent to pharmacy. Patient is to take it for the full 2 weeks. Referral to [facility] our CCM pharmacist who will counsel the patient on diet and also make sure patient is taking all medications correctly. She will be  scheduled with her to follow up with her in 2 weeks.  Patient is also mildly B12 deficient. Dr. Tobie [name] recommends patient start a OTC B12-complex Vitamin to help with that.  Recent consult visits:  none  Hospital visits:  08/21/20-Patient was instructed to go to ED by PCP office, per patient she was admitted   Medications: Outpatient Encounter Medications as of 09/04/2020  Medication Sig  . Calcium Citrate-Vitamin D 500-500 MG-UNT/5GM POWD Take 500 mg by mouth in the morning and at bedtime.  . Ginger, Zingiber officinalis, (GINGER PO) Take 1 capsule by mouth daily.  Marland Kitchen levothyroxine (SYNTHROID) 50 MCG tablet Take 1 tablet (50 mcg total) by mouth daily. (Patient not taking: No sig reported)  . Milk Thistle 250 MG CAPS Take 250 mg by mouth daily.  Marland Kitchen omeprazole (PRILOSEC) 40 MG capsule Take 1 capsule (40 mg total) by mouth 2 (two) times daily.   No facility-administered encounter medications on file as of 09/04/2020.   Patient stated she had a very bad experience with [facility] [name] pharmacist when she went to get her Calcium filled, he came from behind counter and took her down the vitamin isle and said not to waste his time with this.    She stated as of today she has not gotten a message that it is ready for pickup, I told her I would called to check on it for her.  I called Jamie [name], they said the don't fill OTC medications, but have recently filled the patient Omeprazole.  I have notified Jamie [name], CPP.    The patient has stated she cant afford to pay for them out of  pocket right now due to paying for applications.   Jamie [name] CPP, said the issue with the OTC medication may be due to her insurance.  She has finished all antibiotics from H. Pylori, she stated she still has limited taste and smell.  She is also having blurry vision and ears fill full.   Patient stated she got a letter from [place] that Omeprazole is not on their formulary and needs some help with that.  I notified Jamie [name],  CPP.   I called the patient insurance to check to see what other medication we could use, the representative ran a test claim.  The medication Omeprazole is covered, he was not sure why she got the letter.    Patient stated she has stopped all of her medications, when she started antibiotics .    Star Rating Drugs: Carvedilol 05/13/19 29ds Levothyroxine 07/30/20  90 Propranolol 08/24/20  [place], [place] Pharmacist Assistant [phone]

## 2020-09-07 ENCOUNTER — Encounter: Payer: Self-pay | Admitting: Family Medicine

## 2020-09-11 ENCOUNTER — Ambulatory Visit: Payer: Medicare HMO | Admitting: Family Medicine

## 2020-09-25 ENCOUNTER — Other Ambulatory Visit: Payer: Self-pay

## 2020-09-25 MED ORDER — PROMETHAZINE HCL 25 MG PO TABS: 25.0000 mg | ORAL_TABLET | Freq: Four times a day (QID) | ORAL | 0 refills | Status: AC | PRN

## 2020-10-02 ENCOUNTER — Ambulatory Visit: Payer: Medicare HMO | Admitting: Family Medicine

## 2020-10-02 ENCOUNTER — Telehealth: Payer: Self-pay

## 2020-10-02 NOTE — Progress Notes (Signed)
    Chronic Care Management Pharmacy Assistant   Name: [name]  MRN: [id number] DOB: [birth year]  Jamie [name] is an 55 y.o. year old female who presents for his follow-up CCM visit with the clinical pharmacist.  Reason for Encounter: Medication Review   Recent office visits:  None noted since last CCM visit  Recent consult visits:  None noted since last CCM visit  Hospital visits:  None noted since last CCM visit  Medications: Outpatient Encounter Medications as of 10/02/2020  Medication Sig   Calcium Citrate-Vitamin D 500-500 MG-UNT/5GM POWD Take 500 mg by mouth in the morning and at bedtime.   Ginger, Zingiber officinalis, (GINGER PO) Take 1 capsule by mouth daily.   levothyroxine (SYNTHROID) 50 MCG tablet Take 1 tablet (50 mcg total) by mouth daily. (Patient not taking: No sig reported)   Milk Thistle 250 MG CAPS Take 250 mg by mouth daily.   omeprazole (PRILOSEC) 40 MG capsule Take 1 capsule (40 mg total) by mouth 2 (two) times daily.   promethazine (PHENERGAN) 25 MG tablet Take 1 tablet (25 mg total) by mouth every 6 (six) hours as needed.   No facility-administered encounter medications on file as of 10/02/2020.   Contacted Jamie [name] for general disease state and medication adherence call.   Patient stated that she has a lot of anxiety and her shirt is shaking because she is trembling so badly. She reported non-compliance with all of her medications. She stated that mental health medications make her "feel like a zombie". She reported being evicted from her apartment last month, but since she got sick that they gave her another month to move out. She stated that she has to get the rest of her belongings out today " and no one cares and no one wants to help her".  She reported that she is not really eating and is in pain. She stated that her daughter was at the emergency room and probably having a nervous breakdown.  Patient was erratic in her conversation and  ultimately ended the conversation and said she did not know why we call her and hung up.    Marcine Matar, [place] [facility] Pharmacist Assistant

## 2020-10-20 ENCOUNTER — Telehealth: Payer: Medicare HMO

## 2020-11-05 ENCOUNTER — Telehealth: Payer: Self-pay

## 2020-11-05 NOTE — Telephone Encounter (Signed)
Pt calling states her ankle is swollen, red, purple, w/ spots, and hot. Started 3 weeks ago off and on. 4-5 days ago she was not able to wear shoe but can today. Complains of SOB, headache, weakness, and dizziness. Denies chest pain/tightness, fever. Instructed pt to go to urgent care or ED. Pt declined and requested to see Dr Tobie Poet.   Spoke w/ Dr. Dr allowed pt to make appointment for tomorrow w/ pt aware Dr Lu Duffel send pt to hospital due to symptoms. Pt VU and continued to request appointment w/ Dr. Tobie Poet for her opinion. Made appointment.   Harrell Lark 11/05/20 3:43 PM

## 2020-11-06 ENCOUNTER — Ambulatory Visit: Payer: Medicare HMO | Admitting: Family Medicine

## 2020-11-10 DIAGNOSIS — R69 Illness, unspecified: Secondary | ICD-10-CM | POA: Diagnosis not present

## 2020-11-10 DIAGNOSIS — F4310 Post-traumatic stress disorder, unspecified: Secondary | ICD-10-CM | POA: Diagnosis not present

## 2020-11-10 DIAGNOSIS — F1510 Other stimulant abuse, uncomplicated: Secondary | ICD-10-CM | POA: Diagnosis not present

## 2020-11-10 DIAGNOSIS — F10281 Alcohol dependence with alcohol-induced sexual dysfunction: Secondary | ICD-10-CM | POA: Diagnosis not present

## 2020-11-10 DIAGNOSIS — F411 Generalized anxiety disorder: Secondary | ICD-10-CM | POA: Diagnosis not present

## 2020-11-10 DIAGNOSIS — F1120 Opioid dependence, uncomplicated: Secondary | ICD-10-CM | POA: Diagnosis not present

## 2020-11-17 DIAGNOSIS — F10280 Alcohol dependence with alcohol-induced anxiety disorder: Secondary | ICD-10-CM | POA: Diagnosis not present

## 2020-11-17 DIAGNOSIS — R69 Illness, unspecified: Secondary | ICD-10-CM | POA: Diagnosis not present

## 2020-11-24 DIAGNOSIS — R69 Illness, unspecified: Secondary | ICD-10-CM | POA: Diagnosis not present

## 2020-11-24 DIAGNOSIS — F10281 Alcohol dependence with alcohol-induced sexual dysfunction: Secondary | ICD-10-CM | POA: Diagnosis not present

## 2020-11-24 DIAGNOSIS — F10280 Alcohol dependence with alcohol-induced anxiety disorder: Secondary | ICD-10-CM | POA: Diagnosis not present

## 2020-12-01 DIAGNOSIS — F10280 Alcohol dependence with alcohol-induced anxiety disorder: Secondary | ICD-10-CM | POA: Diagnosis not present

## 2020-12-01 DIAGNOSIS — R69 Illness, unspecified: Secondary | ICD-10-CM | POA: Diagnosis not present

## 2020-12-08 DIAGNOSIS — R69 Illness, unspecified: Secondary | ICD-10-CM | POA: Diagnosis not present

## 2020-12-08 DIAGNOSIS — F10280 Alcohol dependence with alcohol-induced anxiety disorder: Secondary | ICD-10-CM | POA: Diagnosis not present

## 2020-12-08 DIAGNOSIS — F10281 Alcohol dependence with alcohol-induced sexual dysfunction: Secondary | ICD-10-CM | POA: Diagnosis not present

## 2020-12-12 ENCOUNTER — Telehealth: Payer: Self-pay

## 2020-12-12 NOTE — Chronic Care Management (AMB) (Signed)
Chronic Care Management Pharmacy Assistant   Name: Jamie [name]  MRN: [id number] DOB: [birth year]  Reason for Encounter: General Adherence Call  Recent office visits:  No visits noted  Recent consult visits:  No visits noted  Hospital visits:  None in previous 6 months  Medications: Outpatient Encounter Medications as of 12/12/2020  Medication Sig   Calcium Citrate-Vitamin D 500-500 MG-UNT/5GM POWD Take 500 mg by mouth in the morning and at bedtime.   Ginger, Zingiber officinalis, (GINGER PO) Take 1 capsule by mouth daily.   levothyroxine (SYNTHROID) 50 MCG tablet Take 1 tablet (50 mcg total) by mouth daily. (Patient not taking: No sig reported)   Milk Thistle 250 MG CAPS Take 250 mg by mouth daily.   omeprazole (PRILOSEC) 40 MG capsule Take 1 capsule (40 mg total) by mouth 2 (two) times daily.   promethazine (PHENERGAN) 25 MG tablet Take 1 tablet (25 mg total) by mouth every 6 (six) hours as needed.   No facility-administered encounter medications on file as of 12/12/2020.    Contacted Jeannie Done for general disease state and medication adherence call. She reports feelings of depression and anxiety. She recently started Cymbalta 30 mg 3 days ago and asked "how long is it supposed to take to work?".  Although she has noticed her anxiety has decreased she still is in a depressive state. I informed her that I would refer this question to CPP. She is also taking Seroquel 25 mg and Gabapentin 300 mg tid. She has not been eating due to lack of appetite and notes that she now weighs 104 lbs. She recently moved to [facility] and besides briefly interacting with her neighbor she does not do anything. She mostly cleans due to her inability to focus on a task such as watching tv.   Star Medications: No star medications  What concerns do you have about your medications? Patient has not been taking medications due to being out of all. She needs refills of levothyroxine, phenergan,  and omeprazole sent to [facility] at [address], [phone]. Patient requested notification when  these are sent.   The patient reports the following side effects with her medications.  Patient is prescribed Seroquel reports not being able to function on this medication. She sleeps all day and must use a lot of energy to do simple tasks like getting dressed. She stated that she will discontinue taking this and I encouraged her to speak with her mental health provider about this.  Patient receives an injection of abilify monthly and experiences "twitching" and requested to be prescribed cogentin or something similar for the side effects  How often do you forget or accidentally miss a dose?  Patient stated she frequently misses doses  Do you use a pillbox? No  Are you having any problems getting your medications from your pharmacy? No  Has the cost of your medications been a concern? No If yes, what medication and is patient assistance available or has it been applied for?  Since last visit with CPP, no interventions have been made:   The patient has not had an ED visit since last contact.   The patient denies problems with their health.   she denies  concerns or questions for Sherre Poot, at this time.   Patient does not check her BP at home  Counseled patient on:  Importance of taking medication daily without missed doses  Benefits of adherence packaging or a pillbox  Access to CCM team for any cost, medication, or pharmacy concerns  Care Gaps: Last annual wellness visit? None noted patient will call to schedule If applicable: Last eye exam / retinopathy screening? Diabetic foot exam?  [place] [facility] CPA, CMA

## 2020-12-15 DIAGNOSIS — F10280 Alcohol dependence with alcohol-induced anxiety disorder: Secondary | ICD-10-CM | POA: Diagnosis not present

## 2020-12-15 DIAGNOSIS — F10281 Alcohol dependence with alcohol-induced sexual dysfunction: Secondary | ICD-10-CM | POA: Diagnosis not present

## 2020-12-15 DIAGNOSIS — R69 Illness, unspecified: Secondary | ICD-10-CM | POA: Diagnosis not present

## 2020-12-18 DIAGNOSIS — R69 Illness, unspecified: Secondary | ICD-10-CM | POA: Diagnosis not present

## 2020-12-18 NOTE — Telephone Encounter (Cosign Needed)
I spoke with [name] this morning and she has been doing well. She stated her Cymbalta has been working better now and no longer has complaints about it. I informed her that she will need to see PCP before getting refills for her medications and she agreed to schedule an appointment. She also requested to inform her care team that she has been 9 days sober with plans of continuing.

## 2020-12-22 DIAGNOSIS — F1510 Other stimulant abuse, uncomplicated: Secondary | ICD-10-CM | POA: Diagnosis not present

## 2020-12-22 DIAGNOSIS — F411 Generalized anxiety disorder: Secondary | ICD-10-CM | POA: Diagnosis not present

## 2020-12-22 DIAGNOSIS — R69 Illness, unspecified: Secondary | ICD-10-CM | POA: Diagnosis not present

## 2020-12-22 DIAGNOSIS — F10280 Alcohol dependence with alcohol-induced anxiety disorder: Secondary | ICD-10-CM | POA: Diagnosis not present

## 2020-12-22 DIAGNOSIS — F4310 Post-traumatic stress disorder, unspecified: Secondary | ICD-10-CM | POA: Diagnosis not present

## 2020-12-22 DIAGNOSIS — F1120 Opioid dependence, uncomplicated: Secondary | ICD-10-CM | POA: Diagnosis not present
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
## 2020-12-23 ENCOUNTER — Ambulatory Visit (INDEPENDENT_AMBULATORY_CARE_PROVIDER_SITE_OTHER): Payer: Medicare HMO | Admitting: Family Medicine

## 2020-12-23 ENCOUNTER — Other Ambulatory Visit: Payer: Self-pay

## 2020-12-23 VITALS — BP 126/72 | HR 88 | Temp 97.6°F | Resp 18 | Ht 63.0 in | Wt 105.6 lb

## 2020-12-23 DIAGNOSIS — R69 Illness, unspecified: Secondary | ICD-10-CM | POA: Diagnosis not present

## 2020-12-23 DIAGNOSIS — F1190 Opioid use, unspecified, uncomplicated: Secondary | ICD-10-CM | POA: Diagnosis not present

## 2020-12-23 DIAGNOSIS — I10 Essential (primary) hypertension: Secondary | ICD-10-CM

## 2020-12-23 DIAGNOSIS — E440 Moderate protein-calorie malnutrition: Secondary | ICD-10-CM

## 2020-12-23 DIAGNOSIS — F3160 Bipolar disorder, current episode mixed, unspecified: Secondary | ICD-10-CM

## 2020-12-23 DIAGNOSIS — K7030 Alcoholic cirrhosis of liver without ascites: Secondary | ICD-10-CM | POA: Diagnosis not present

## 2020-12-23 DIAGNOSIS — E039 Hypothyroidism, unspecified: Secondary | ICD-10-CM | POA: Diagnosis not present

## 2020-12-23 MED ORDER — LACTULOSE 10 GM/15ML PO SOLN: 10.0000 g | Freq: Two times a day (BID) | ORAL | 0 refills | Status: AC

## 2020-12-23 MED ORDER — BENZTROPINE MESYLATE 1 MG PO TABS: 1.0000 mg | ORAL_TABLET | Freq: Two times a day (BID) | ORAL | 1 refills | Status: AC

## 2020-12-24 LAB — COMPREHENSIVE METABOLIC PANEL
ALT: 8 IU/L (ref 0–32)
AST: 17 IU/L (ref 0–40)
Albumin/Globulin Ratio: 1.4 (ref 1.2–2.2)
Albumin: 4.2 g/dL (ref 3.8–4.9)
Alkaline Phosphatase: 147 IU/L — ABNORMAL HIGH (ref 44–121)
BUN/Creatinine Ratio: 11 (ref 9–23)
BUN: 13 mg/dL (ref 6–24)
Bilirubin Total: 0.4 mg/dL (ref 0.0–1.2)
CO2: 20 mmol/L (ref 20–29)
Calcium: 9.2 mg/dL (ref 8.7–10.2)
Chloride: 101 mmol/L (ref 96–106)
Creatinine, Ser: 1.18 mg/dL — ABNORMAL HIGH (ref 0.57–1.00)
Globulin, Total: 3 g/dL (ref 1.5–4.5)
Glucose: 59 mg/dL — ABNORMAL LOW (ref 65–99)
Potassium: 4.4 mmol/L (ref 3.5–5.2)
Sodium: 139 mmol/L (ref 134–144)
Total Protein: 7.2 g/dL (ref 6.0–8.5)
eGFR: 55 mL/min/{1.73_m2} — ABNORMAL LOW (ref >59)

## 2020-12-24 LAB — CBC WITH DIFFERENTIAL/PLATELET
Basophils Absolute: 0 10*3/uL (ref 0.0–0.2)
Basos: 1 %
EOS (ABSOLUTE): 0.2 10*3/uL (ref 0.0–0.4)
Eos: 3 %
Hematocrit: 42.4 % (ref 34.0–46.6)
Hemoglobin: 13.7 g/dL (ref 11.1–15.9)
Immature Grans (Abs): 0 10*3/uL (ref 0.0–0.1)
Immature Granulocytes: 0 %
Lymphocytes Absolute: 2.4 10*3/uL (ref 0.7–3.1)
Lymphs: 44 %
MCH: 26.8 pg (ref 26.6–33.0)
MCHC: 32.3 g/dL (ref 31.5–35.7)
MCV: 83 fL (ref 79–97)
Monocytes Absolute: 0.4 10*3/uL (ref 0.1–0.9)
Monocytes: 8 %
Neutrophils Absolute: 2.4 10*3/uL (ref 1.4–7.0)
Neutrophils: 44 %
Platelets: 190 10*3/uL (ref 150–450)
RBC: 5.12 x10E6/uL (ref 3.77–5.28)
RDW: 13 % (ref 11.7–15.4)
WBC: 5.4 10*3/uL (ref 3.4–10.8)

## 2020-12-24 LAB — LIPID PANEL
Chol/HDL Ratio: 2.4 ratio (ref 0.0–4.4)
Cholesterol, Total: 180 mg/dL (ref 100–199)
HDL: 74 mg/dL (ref >39)
LDL Chol Calc (NIH): 89 mg/dL (ref 0–99)
Triglycerides: 94 mg/dL (ref 0–149)
VLDL Cholesterol Cal: 17 mg/dL (ref 5–40)

## 2020-12-24 LAB — CARDIOVASCULAR RISK ASSESSMENT

## 2020-12-24 LAB — TSH: TSH: 5.55 u[IU]/mL — ABNORMAL HIGH (ref 0.450–4.500)

## 2020-12-25 DIAGNOSIS — F1120 Opioid dependence, uncomplicated: Secondary | ICD-10-CM | POA: Diagnosis not present

## 2020-12-25 DIAGNOSIS — F4310 Post-traumatic stress disorder, unspecified: Secondary | ICD-10-CM | POA: Diagnosis not present

## 2020-12-25 DIAGNOSIS — R69 Illness, unspecified: Secondary | ICD-10-CM | POA: Diagnosis not present

## 2020-12-25 DIAGNOSIS — F411 Generalized anxiety disorder: Secondary | ICD-10-CM | POA: Diagnosis not present

## 2020-12-25 DIAGNOSIS — F1510 Other stimulant abuse, uncomplicated: Secondary | ICD-10-CM | POA: Diagnosis not present

## 2020-12-26 ENCOUNTER — Other Ambulatory Visit: Payer: Self-pay

## 2020-12-26 MED ORDER — LEVOTHYROXINE SODIUM 50 MCG PO TABS: 50.0000 ug | ORAL_TABLET | Freq: Every day | ORAL | 3 refills | Status: AC

## 2020-12-26 NOTE — Telephone Encounter (Signed)
Refill sent to pharmacy.   

## 2020-12-29 ENCOUNTER — Encounter: Payer: Self-pay | Admitting: Family Medicine

## 2020-12-30 DIAGNOSIS — F1510 Other stimulant abuse, uncomplicated: Secondary | ICD-10-CM | POA: Diagnosis not present

## 2020-12-30 DIAGNOSIS — R69 Illness, unspecified: Secondary | ICD-10-CM | POA: Diagnosis not present

## 2020-12-30 DIAGNOSIS — F1120 Opioid dependence, uncomplicated: Secondary | ICD-10-CM | POA: Diagnosis not present

## 2020-12-30 DIAGNOSIS — F411 Generalized anxiety disorder: Secondary | ICD-10-CM | POA: Diagnosis not present

## 2020-12-30 DIAGNOSIS — F4310 Post-traumatic stress disorder, unspecified: Secondary | ICD-10-CM | POA: Diagnosis not present

## 2020-12-30 DIAGNOSIS — F10280 Alcohol dependence with alcohol-induced anxiety disorder: Secondary | ICD-10-CM | POA: Diagnosis not present

## 2021-01-01 DIAGNOSIS — F411 Generalized anxiety disorder: Secondary | ICD-10-CM | POA: Diagnosis not present

## 2021-01-01 DIAGNOSIS — F1510 Other stimulant abuse, uncomplicated: Secondary | ICD-10-CM | POA: Diagnosis not present

## 2021-01-01 DIAGNOSIS — F1120 Opioid dependence, uncomplicated: Secondary | ICD-10-CM | POA: Diagnosis not present

## 2021-01-01 DIAGNOSIS — R69 Illness, unspecified: Secondary | ICD-10-CM | POA: Diagnosis not present

## 2021-01-01 DIAGNOSIS — F4310 Post-traumatic stress disorder, unspecified: Secondary | ICD-10-CM | POA: Diagnosis not present

## 2021-01-06 DIAGNOSIS — F1510 Other stimulant abuse, uncomplicated: Secondary | ICD-10-CM | POA: Diagnosis not present

## 2021-01-06 DIAGNOSIS — F4310 Post-traumatic stress disorder, unspecified: Secondary | ICD-10-CM | POA: Diagnosis not present

## 2021-01-06 DIAGNOSIS — F1120 Opioid dependence, uncomplicated: Secondary | ICD-10-CM | POA: Diagnosis not present

## 2021-01-06 DIAGNOSIS — R69 Illness, unspecified: Secondary | ICD-10-CM | POA: Diagnosis not present

## 2021-01-06 DIAGNOSIS — F411 Generalized anxiety disorder: Secondary | ICD-10-CM | POA: Diagnosis not present

## 2021-01-08 DIAGNOSIS — F4310 Post-traumatic stress disorder, unspecified: Secondary | ICD-10-CM | POA: Diagnosis not present

## 2021-01-08 DIAGNOSIS — F1120 Opioid dependence, uncomplicated: Secondary | ICD-10-CM | POA: Diagnosis not present

## 2021-01-08 DIAGNOSIS — F1510 Other stimulant abuse, uncomplicated: Secondary | ICD-10-CM | POA: Diagnosis not present

## 2021-01-08 DIAGNOSIS — F411 Generalized anxiety disorder: Secondary | ICD-10-CM | POA: Diagnosis not present

## 2021-01-08 DIAGNOSIS — R69 Illness, unspecified: Secondary | ICD-10-CM | POA: Diagnosis not present

## 2021-01-13 DIAGNOSIS — F10281 Alcohol dependence with alcohol-induced sexual dysfunction: Secondary | ICD-10-CM | POA: Diagnosis not present

## 2021-01-13 DIAGNOSIS — F1120 Opioid dependence, uncomplicated: Secondary | ICD-10-CM | POA: Diagnosis not present

## 2021-01-13 DIAGNOSIS — R69 Illness, unspecified: Secondary | ICD-10-CM | POA: Diagnosis not present

## 2021-01-13 DIAGNOSIS — F10280 Alcohol dependence with alcohol-induced anxiety disorder: Secondary | ICD-10-CM | POA: Diagnosis not present

## 2021-01-15 DIAGNOSIS — L03116 Cellulitis of left lower limb: Secondary | ICD-10-CM | POA: Diagnosis not present

## 2021-01-15 DIAGNOSIS — J209 Acute bronchitis, unspecified: Secondary | ICD-10-CM | POA: Diagnosis not present

## 2021-01-15 DIAGNOSIS — Z681 Body mass index (BMI) 19 or less, adult: Secondary | ICD-10-CM | POA: Diagnosis not present

## 2021-01-15 DIAGNOSIS — J029 Acute pharyngitis, unspecified: Secondary | ICD-10-CM | POA: Diagnosis not present

## 2021-01-15 DIAGNOSIS — Z79899 Other long term (current) drug therapy: Secondary | ICD-10-CM | POA: Diagnosis not present

## 2021-01-15 DIAGNOSIS — J069 Acute upper respiratory infection, unspecified: Secondary | ICD-10-CM | POA: Diagnosis not present

## 2021-01-16 DIAGNOSIS — Z79899 Other long term (current) drug therapy: Secondary | ICD-10-CM | POA: Diagnosis not present

## 2021-01-19 DIAGNOSIS — R69 Illness, unspecified: Secondary | ICD-10-CM | POA: Diagnosis not present

## 2021-01-19 DIAGNOSIS — F1120 Opioid dependence, uncomplicated: Secondary | ICD-10-CM | POA: Diagnosis not present

## 2021-01-19 DIAGNOSIS — F1510 Other stimulant abuse, uncomplicated: Secondary | ICD-10-CM | POA: Diagnosis not present

## 2021-01-19 DIAGNOSIS — F4310 Post-traumatic stress disorder, unspecified: Secondary | ICD-10-CM | POA: Diagnosis not present

## 2021-01-19 DIAGNOSIS — F411 Generalized anxiety disorder: Secondary | ICD-10-CM | POA: Diagnosis not present

## 2021-01-26 DIAGNOSIS — R69 Illness, unspecified: Secondary | ICD-10-CM | POA: Diagnosis not present

## 2021-01-26 DIAGNOSIS — F1120 Opioid dependence, uncomplicated: Secondary | ICD-10-CM | POA: Diagnosis not present

## 2021-01-27 DIAGNOSIS — H9202 Otalgia, left ear: Secondary | ICD-10-CM | POA: Diagnosis not present

## 2021-01-27 DIAGNOSIS — Z6820 Body mass index (BMI) 20.0-20.9, adult: Secondary | ICD-10-CM | POA: Diagnosis not present

## 2021-01-27 DIAGNOSIS — H6692 Otitis media, unspecified, left ear: Secondary | ICD-10-CM | POA: Diagnosis not present

## 2021-01-27 DIAGNOSIS — R590 Localized enlarged lymph nodes: Secondary | ICD-10-CM | POA: Diagnosis not present

## 2021-01-27 DIAGNOSIS — L03116 Cellulitis of left lower limb: Secondary | ICD-10-CM | POA: Diagnosis not present

## 2021-01-29 DIAGNOSIS — F10281 Alcohol dependence with alcohol-induced sexual dysfunction: Secondary | ICD-10-CM | POA: Diagnosis not present

## 2021-01-29 DIAGNOSIS — F1510 Other stimulant abuse, uncomplicated: Secondary | ICD-10-CM | POA: Diagnosis not present

## 2021-01-29 DIAGNOSIS — R69 Illness, unspecified: Secondary | ICD-10-CM | POA: Diagnosis not present

## 2021-01-29 DIAGNOSIS — F1120 Opioid dependence, uncomplicated: Secondary | ICD-10-CM | POA: Diagnosis not present

## 2021-01-29 DIAGNOSIS — F10280 Alcohol dependence with alcohol-induced anxiety disorder: Secondary | ICD-10-CM | POA: Diagnosis not present

## 2021-01-29 DIAGNOSIS — F4310 Post-traumatic stress disorder, unspecified: Secondary | ICD-10-CM | POA: Diagnosis not present

## 2021-01-29 DIAGNOSIS — F411 Generalized anxiety disorder: Secondary | ICD-10-CM | POA: Diagnosis not present

## 2021-02-12 ENCOUNTER — Ambulatory Visit: Payer: Medicare HMO

## 2021-02-19 DIAGNOSIS — F10280 Alcohol dependence with alcohol-induced anxiety disorder: Secondary | ICD-10-CM | POA: Diagnosis not present

## 2021-02-19 DIAGNOSIS — R69 Illness, unspecified: Secondary | ICD-10-CM | POA: Diagnosis not present

## 2021-02-19 DIAGNOSIS — F1120 Opioid dependence, uncomplicated: Secondary | ICD-10-CM | POA: Diagnosis not present

## 2021-02-25 DIAGNOSIS — R404 Transient alteration of awareness: Secondary | ICD-10-CM | POA: Diagnosis not present

## 2021-02-25 DIAGNOSIS — Z743 Need for continuous supervision: Secondary | ICD-10-CM | POA: Diagnosis not present

## 2021-02-25 DIAGNOSIS — T887XXA Unspecified adverse effect of drug or medicament, initial encounter: Secondary | ICD-10-CM | POA: Diagnosis not present

## 2021-02-25 DIAGNOSIS — T50904A Poisoning by unspecified drugs, medicaments and biological substances, undetermined, initial encounter: Secondary | ICD-10-CM | POA: Diagnosis not present

## 2021-03-26 DIAGNOSIS — 419620001 Death: Secondary | SNOMED CT | POA: Diagnosis not present

## 2021-03-26 DEATH — deceased

## 2021-04-01 ENCOUNTER — Ambulatory Visit: Payer: Medicare HMO | Admitting: Family Medicine
